# Patient Record
Sex: Female | Born: 1990 | Race: Black or African American | Hispanic: No | Marital: Single | Smoking: Former smoker
Health system: Southern US, Community
[De-identification: ages and names within clinical notes are randomized; demographics above are authoritative.]

## PROBLEM LIST (undated history)

## (undated) DIAGNOSIS — E669 Obesity, unspecified: Secondary | ICD-10-CM

## (undated) DIAGNOSIS — N915 Oligomenorrhea, unspecified: Secondary | ICD-10-CM

## (undated) DIAGNOSIS — IMO0002 Reserved for concepts with insufficient information to code with codable children: Secondary | ICD-10-CM

## (undated) DIAGNOSIS — E282 Polycystic ovarian syndrome: Secondary | ICD-10-CM

## (undated) DIAGNOSIS — R635 Abnormal weight gain: Secondary | ICD-10-CM

## (undated) HISTORY — DX: Abnormal weight gain: R63.5

## (undated) HISTORY — DX: Polycystic ovarian syndrome: E28.2

## (undated) HISTORY — DX: Oligomenorrhea, unspecified: N91.5

## (undated) HISTORY — DX: Reserved for concepts with insufficient information to code with codable children: IMO0002

---
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
## 2011-07-10 ENCOUNTER — Ambulatory Visit (INDEPENDENT_AMBULATORY_CARE_PROVIDER_SITE_OTHER): Payer: 59 | Admitting: Family Medicine

## 2011-07-10 VITALS — BP 128/80 | HR 108 | Temp 101.7°F | Resp 24 | Ht 69.0 in | Wt 298.2 lb

## 2011-07-10 DIAGNOSIS — J039 Acute tonsillitis, unspecified: Secondary | ICD-10-CM

## 2011-07-10 MED ORDER — CEFTRIAXONE SODIUM 1 G IJ SOLR
1.0000 g | Freq: Once | INTRAMUSCULAR | Status: AC
  Administered 2011-07-10: 1 g via INTRAMUSCULAR

## 2011-07-10 MED ORDER — CEFDINIR 250 MG/5ML PO SUSR: ORAL | Status: DC

## 2011-07-10 NOTE — Progress Notes (Signed)
21 year old woman who comes in with 24 hours of progressive sore throat, dysphagia, fever, and nausea. She had a sore throat one month ago and was given antibiotics but did not complete her course.  Objective: Garbled voice, no drooling, no acute distress  HEENT: Markedly swollen tonsils bilaterally with exudates, airway patent Skin: No rash Heart: No murmur Chest: Clear  Assessment: Tonsillitis  Plan: Rocephin 1 g today followed by Ceftin here to 50 per mL, 0.5 mL's per day x10 days Recheck tomorrow 1. Tonsillitis  Culture, Group A Strep, cefTRIAXone (ROCEPHIN) injection 1 g, cefdinir (OMNICEF) 250 MG/5ML suspension

## 2011-07-10 NOTE — Patient Instructions (Signed)
Tonsillitis Tonsils are lumps of lymphoid tissues at the back of the throat. Each tonsil has 20 crevices (crypts). Tonsils help fight nose and throat infections and keep infection from spreading to other parts of the body for the first 18 months of life. Tonsillitis is an infection of the throat that causes the tonsils to become red, tender, and swollen. CAUSES Sudden and, if treated, temporary (acute) tonsillitis is usually caused by infection with streptococcal bacteria. Long lasting (chronic) tonsillitis occurs when the crypts of the tonsils become filled with pieces of food and bacteria, which makes it easy for the tonsils to become constantly infected. SYMPTOMS  Symptoms of tonsillitis include:  A sore throat.   White patches on the tonsils.   Fever.   Tiredness.  DIAGNOSIS Tonsillitis can be diagnosed through a physical exam. Diagnosis can be confirmed with the results of lab tests, including a throat culture. TREATMENT  The goals of tonsillitis treatment include the reduction of the severity and duration of symptoms, prevention of associated conditions, and prevention of disease transmission. Tonsillitis caused by bacteria can be treated with antibiotics. Usually, treatment with antibiotics is started before the cause of the tonsillitis is known. However, if it is determined that the cause is not bacterial, antibiotics will not treat the tonsillitis. If attacks of tonsillitis are severe and frequent, your caregiver may recommend surgery to remove the tonsils (tonsillectomy). HOME CARE INSTRUCTIONS   Rest as much as possible and get plenty of sleep.   Drink plenty of fluids. While the throat is very sore, eat soft foods or liquids, such as sherbet, soups, or instant breakfast drinks.   Eat frozen ice pops.   Older children and adults may gargle with a warm or cold liquid to help soothe the throat. Mix 1 teaspoon of salt in 1 cup of water.   Other family members who also develop a  sore throat or fever should have a medical exam or throat culture.   Only take over-the-counter or prescription medicines for pain, discomfort, or fever as directed by your caregiver.   If you are given antibiotics, take them as directed. Finish them even if you start to feel better.  SEEK MEDICAL CARE IF:   Your baby is older than 3 months with a rectal temperature of 100.5 F (38.1 C) or higher for more than 1 day.   Large, tender lumps develop in your neck.   A rash develops.   Green, yellow-brown, or bloody substance is coughed up.   You are unable to swallow liquids or food for 24 hours.   Your child is unable to swallow food or liquids for 12 hours.  SEEK IMMEDIATE MEDICAL CARE IF:   You develop any new symptoms such as vomiting, severe headache, stiff neck, chest pain, or trouble breathing or swallowing.   You have severe throat pain along with drooling or voice changes.   You have severe pain, unrelieved with recommended medications.   You are unable to fully open the mouth.   You develop redness, swelling, or severe pain anywhere in the neck.   You have a fever.   Your baby is older than 3 months with a rectal temperature of 102 F (38.9 C) or higher.   Your baby is 12 months old or younger with a rectal temperature of 100.4 F (38 C) or higher.  MAKE SURE YOU:   Understand these instructions.   Will watch your condition.   Will get help right away if you are not  watch your condition.   Will get help right away if you are not doing well or get worse.  Document Released: 11/02/2004 Document Revised: 01/12/2011 Document Reviewed: 03/31/2010  [employer] Patient Information 2012 [employer].

## 2011-07-11 ENCOUNTER — Ambulatory Visit (INDEPENDENT_AMBULATORY_CARE_PROVIDER_SITE_OTHER): Payer: 59 | Admitting: Family Medicine

## 2011-07-11 VITALS — BP 133/82 | HR 106 | Temp 99.0°F | Resp 18 | Ht 69.0 in | Wt 298.0 lb

## 2011-07-11 DIAGNOSIS — J039 Acute tonsillitis, unspecified: Secondary | ICD-10-CM

## 2011-07-11 MED ORDER — CEFTRIAXONE SODIUM 1 G IJ SOLR
1.0000 g | Freq: Once | INTRAMUSCULAR | Status: AC
  Administered 2011-07-11: 1 g via INTRAMUSCULAR

## 2011-07-11 NOTE — Progress Notes (Signed)
21 year old woman comes in for followup tonsillitis having had her first visit yesterday. She is feeling better and has had no sweats or chills overnight. She does feel hot however. She was unable to get all of her medicine from the pharmacy yesterday but did promise to get in the next day or so.  Objective: No acute distress, patient is calm and articulate  Neck: Moderate adenopathy in the anterior cervical region, supple, no thyromegaly  Chest: Normal respirations  Oropharynx: 3+ swollen tonsils with exudates, less swollen than yesterday however.  Assessment: Improving tonsillitis  Plan: Continue Ceftin ear, second injection of Rocephin tonight, follow up as needed Throat culture pending

## 2011-07-13 LAB — CULTURE, GROUP A STREP

## 2011-08-07 DIAGNOSIS — R87619 Unspecified abnormal cytological findings in specimens from cervix uteri: Secondary | ICD-10-CM

## 2011-08-07 DIAGNOSIS — N870 Mild cervical dysplasia: Secondary | ICD-10-CM | POA: Insufficient documentation

## 2011-08-07 DIAGNOSIS — IMO0002 Reserved for concepts with insufficient information to code with codable children: Secondary | ICD-10-CM

## 2011-08-07 HISTORY — DX: Reserved for concepts with insufficient information to code with codable children: IMO0002

## 2011-08-07 HISTORY — DX: Unspecified abnormal cytological findings in specimens from cervix uteri: R87.619

## 2011-08-21 ENCOUNTER — Encounter: Payer: Self-pay | Admitting: Obstetrics and Gynecology

## 2011-08-21 ENCOUNTER — Ambulatory Visit (INDEPENDENT_AMBULATORY_CARE_PROVIDER_SITE_OTHER): Payer: 59 | Admitting: Obstetrics and Gynecology

## 2011-08-21 VITALS — BP 120/82 | HR 72 | Ht >= 80 in | Wt 298.0 lb

## 2011-08-21 DIAGNOSIS — IMO0001 Reserved for inherently not codable concepts without codable children: Secondary | ICD-10-CM

## 2011-08-21 DIAGNOSIS — Z124 Encounter for screening for malignant neoplasm of cervix: Secondary | ICD-10-CM

## 2011-08-21 DIAGNOSIS — Z113 Encounter for screening for infections with a predominantly sexual mode of transmission: Secondary | ICD-10-CM

## 2011-08-21 DIAGNOSIS — Z309 Encounter for contraceptive management, unspecified: Secondary | ICD-10-CM

## 2011-08-21 DIAGNOSIS — N915 Oligomenorrhea, unspecified: Secondary | ICD-10-CM | POA: Insufficient documentation

## 2011-08-21 DIAGNOSIS — Z01419 Encounter for gynecological examination (general) (routine) without abnormal findings: Secondary | ICD-10-CM

## 2011-08-21 MED ORDER — NORGESTIM-ETH ESTRAD TRIPHASIC 0.18/0.215/0.25 MG-25 MCG PO TABS: 1.0000 | ORAL_TABLET | Freq: Every day | ORAL | Status: DC
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
## 2011-08-21 NOTE — Progress Notes (Signed)
Subjective:    Erica Mayer is a 21 y.o. female, G0P0, who presents for an annual exam. The patient requests STD testing and a review of contraceptive methods.  Handout given on methods of contraception and an overview of each (medical and OTC). Patient interested in [facility] without a problem.  Wants Ortho Tri Cylclen Lo.  Menstrual cycle:   LMP: Patient's last menstrual period was 08/08/2011.  PMH: PCOS, insulin resistance, oligomenorrhea             Review of Systems Pertinent items are noted in HPI. Denies pelvic pain, urinary tract symptoms, vaginitis symptoms, irregular bleeding, menopausal symptoms, change in bowel habits or rectal bleeding   Objective:    BP 120/82  Pulse 72  Ht 8' 8.5" (2.654 m)  Wt 298 lb (135.172 kg)  BMI 19.19 kg/m2  LMP 08/08/2011   Wt Readings from Last 1 Encounters:  08/21/11 298 lb (135.172 kg)   Body mass index is 19.19 kg/(m^2). General Appearance: Alert, no acute distress HEENT: Grossly normal Neck / Thyroid: Supple, no thyromegaly or cervical adenopathy Lungs: Clear to auscultation bilaterally Back: No CVA tenderness Breast Exam: No masses or nodes.No dimpling, nipple retraction or discharge. Cardiovascular: Regular rate and rhythm.  Gastrointestinal: Soft, non-tender, no masses or organomegaly Pelvic Exam: EGBUS-wnl, vagina-normal rugae, cervix- without lesions or tenderness, uterus appears normal size shape and consistency-exam limited by habitus,  adnexae-no masses or tenderness Lymphatic Exam: Non-palpable nodes in neck, clavicular,  axillary, or inguinal regions  Skin: no rashes or abnormalities Extremities: no clubbing cyanosis or edema  Neurologic: grossly normal Psychiatric: Alert and oriented  UPT: negative   Assessment:   Routine GYN Exam  Need for Contraception  H/O PCOS/Insulin Resistance/Oligomenorrhea   Plan:  STD testing  BCP instructions  Ortho Tri Cyclen Lo  #1 1 po qd 11 rf  PAP sent  RTO 1 year or  prn  Marcoantonio Legault,ELMIRAPA-C

## 2011-08-22 LAB — RPR

## 2011-08-22 LAB — HIV ANTIBODY (ROUTINE TESTING W REFLEX): HIV: NONREACTIVE

## 2011-08-23 LAB — PAP IG, CT-NG, RFX HPV ASCU
Chlamydia Probe Amp: NEGATIVE
GC Probe Amp: NEGATIVE

## 2011-08-24 ENCOUNTER — Telehealth: Payer: Self-pay | Admitting: Obstetrics and Gynecology

## 2011-08-24 NOTE — Telephone Encounter (Signed)
Tc to pt regarding msg below.  Pt sched for colpo on Wednesday 09/06/11 @1115  w/ [facility], pt instructions given, pt voices understanding.

## 2011-08-24 NOTE — Telephone Encounter (Signed)
Message copied by Delon Sacramento on Thu Aug 24, 2011 10:24 AM ------      Message from: Henreitta Leber      Created: Thu Aug 24, 2011  8:40 AM       Please schedule this patient for a colposcopy.  Thank you.  EP

## 2011-09-06 ENCOUNTER — Ambulatory Visit (INDEPENDENT_AMBULATORY_CARE_PROVIDER_SITE_OTHER): Payer: 59 | Admitting: Obstetrics and Gynecology

## 2011-09-06 VITALS — BP 122/68 | Ht 67.0 in | Wt 300.0 lb

## 2011-09-06 DIAGNOSIS — R87612 Low grade squamous intraepithelial lesion on cytologic smear of cervix (LGSIL): Secondary | ICD-10-CM

## 2011-09-06 DIAGNOSIS — IMO0002 Reserved for concepts with insufficient information to code with codable children: Secondary | ICD-10-CM

## 2011-09-06 NOTE — Progress Notes (Signed)
Previous Pap Smear: 08-21-11  Previous Colposcopy: na LMP: 07-2011 irregular Contraception: none G,P: 0  Patient ID: Erica Mayer, female   DOB: Feb 22, 1990, 22 y.o.   MRN: [id number]  Chief Complaint  Patient presents with  . Gynecologic Exam    colpo    HPI Erica Mayer is a 21 y.o. female.  With pap showing LGSIL HPI  Indications: Pap smear on July 2013 showed: low-grade squamous intraepithelial neoplasia (LGSIL - encompassing HPV,mild dysplasia,CIN I). Previous colposcopy: none. Prior cervical treatment: no treatment.  Past Medical History  Diagnosis Date  . PCOS (polycystic ovarian syndrome)   . Oligomenorrhea   . Weight gain 09/09/2008    No past surgical history on file.  Family History  Problem Relation Age of Onset  . Kidney disease Mother   . Heart disease Father     congestive heart failure  . Diabetes Father   . Hypertension Father   . Cancer Paternal Aunt     Social History History  Substance Use Topics  . Smoking status: Former Smoker -- 0.3 packs/day for 4 years    Types: Cigars    Quit date: 06/16/2011  . Smokeless tobacco: Never Used   Comment: pt smoke black and mild  . Alcohol Use: Yes     occasional    No Known Allergies  Current Outpatient Prescriptions  Medication Sig Dispense Refill  . cefdinir (OMNICEF) 250 MG/5ML suspension 12.5 ml daily  100 mL  0  . Norgestimate-Ethinyl Estradiol Triphasic (ORTHO TRI-CYCLEN LO) 0.18/0.215/0.25 MG-25 MCG tablet Take 1 tablet by mouth daily.  1 Package  11    Review of Systems Review of Systems  Blood pressure 122/68, height 5\' 7"  (1.702 m), weight 300 lb (136.079 kg), last menstrual period 07/09/2011.  Physical Exam Physical Exam  Assessment    Procedure Details  The risks and benefits of the procedure and Written informed consent obtained.  Speculum placed in vagina and excellent visualization of cervix achieved, cervix swabbed x 3 with acetic acid solution.  Specimens: Biopsies from 11, 12  o'clock and ECC  Complications: none    Plan    Specimens labelled and sent to Pathology. Will call to discuss Pathology results in 2 weeks.      Iran Kievit P 09/13/2011, 12:40 AM

## 2011-09-06 NOTE — Progress Notes (Deleted)
Patient ID: Lisset Ketchem, female   DOB: 05-05-1990, 21 y.o.   MRN: [id number]  Chief Complaint  Patient presents with  . Gynecologic Exam    colpo    HPI Kanyon Bunn is a 21 y.o. female.  *** HPI  Indications: Pap smear on {MONTH:10108} 20*** showed: no abnormalities. Previous colposcopy: in ***. Prior cervical treatment: {Therapies; recommendations colposcopy:727}.  Past Medical History  Diagnosis Date  . PCOS (polycystic ovarian syndrome)   . Oligomenorrhea   . Weight gain 09/09/2008    No past surgical history on file.  Family History  Problem Relation Age of Onset  . Kidney disease Mother   . Heart disease Father     congestive heart failure  . Diabetes Father   . Hypertension Father   . Cancer Paternal Aunt     Social History History  Substance Use Topics  . Smoking status: Former Smoker -- 0.3 packs/day for 4 years    Types: Cigars    Quit date: 06/16/2011  . Smokeless tobacco: Never Used   Comment: pt smoke black and mild  . Alcohol Use: Yes     occasional    No Known Allergies  Current Outpatient Prescriptions  Medication Sig Dispense Refill  . cefdinir (OMNICEF) 250 MG/5ML suspension 12.5 ml daily  100 mL  0  . Norgestimate-Ethinyl Estradiol Triphasic (ORTHO TRI-CYCLEN LO) 0.18/0.215/0.25 MG-25 MCG tablet Take 1 tablet by mouth daily.  1 Package  11    Review of Systems Review of Systems  Last menstrual period 08/08/2011.  Physical Exam Physical Exam  Data Reviewed ***  Assessment    Procedure Details  The risks and benefits of the procedure and Written informed consent obtained.

## 2011-09-10 DIAGNOSIS — IMO0002 Reserved for concepts with insufficient information to code with codable children: Secondary | ICD-10-CM | POA: Insufficient documentation

## 2011-11-22 ENCOUNTER — Other Ambulatory Visit: Payer: Self-pay | Admitting: Obstetrics and Gynecology

## 2011-11-22 ENCOUNTER — Telehealth: Payer: Self-pay | Admitting: Obstetrics and Gynecology

## 2011-11-22 MED ORDER — NORGESTIM-ETH ESTRAD TRIPHASIC 0.18/0.215/0.25 MG-25 MCG PO TABS: 1.0000 | ORAL_TABLET | Freq: Every day | ORAL | Status: DC

## 2011-11-22 NOTE — Telephone Encounter (Signed)
VM from pt.  Recevied Rx in 08/2011 but did not get filled.  Wants new Rx.  [phone]

## 2011-11-22 NOTE — Telephone Encounter (Signed)
Spoke with pt rgd msg pt states need rx for bc pt states given rx in July never filled rx pt state cycle started 11/18/11 last intercourse June advised pt will consult with provider and call her back pt voice understanding

## 2011-11-22 NOTE — Telephone Encounter (Signed)
Order for Ortho Tri Cyclen Lo was placed as requested for patient with refills for one year.  She should be reminded to use a back up method for the first cycle of pills.  Alvenia Treese, PA-C

## 2011-11-23 NOTE — Telephone Encounter (Signed)
Lm on vm rx sent to pharm use bum first pack of pills

## 2011-12-21 ENCOUNTER — Other Ambulatory Visit: Payer: Self-pay

## 2011-12-21 ENCOUNTER — Telehealth: Payer: Self-pay | Admitting: Obstetrics and Gynecology

## 2011-12-21 MED ORDER — NORGESTIM-ETH ESTRAD TRIPHASIC 0.18/0.215/0.25 MG-25 MCG PO TABS: 1.0000 | ORAL_TABLET | Freq: Every day | ORAL | Status: DC

## 2011-12-21 MED ORDER — NORETHINDRONE ACET-ETHINYL EST 1-20 MG-MCG PO TABS: 1.0000 | ORAL_TABLET | Freq: Every day | ORAL | Status: DC

## 2011-12-21 NOTE — Telephone Encounter (Signed)
Pt needs generic rx for Ortho Tricyclen Lo;however generic not made. Pt states,"ins will pay for any generic". Will consult with provider per recs on a generic bcp.

## 2011-12-21 NOTE — Telephone Encounter (Signed)
Patient requesting a generic for Ortho Tri Cyclen Lo.  Since there is none she may have her alternative "any generic".  Prescribe Microgestin 1/20  #1  1 po qd with 11 refills.  Thank you.  EP

## 2011-12-21 NOTE — Telephone Encounter (Signed)
Rx e-pres to pharm on file per EP recs.

## 2012-01-01 ENCOUNTER — Encounter (INDEPENDENT_AMBULATORY_CARE_PROVIDER_SITE_OTHER): Payer: 59 | Admitting: Obstetrics and Gynecology

## 2012-01-01 ENCOUNTER — Encounter: Payer: Self-pay | Admitting: Obstetrics and Gynecology

## 2012-01-01 DIAGNOSIS — N870 Mild cervical dysplasia: Secondary | ICD-10-CM

## 2012-01-01 NOTE — Progress Notes (Signed)
erro  neous encounter

## 2012-12-01 ENCOUNTER — Emergency Department (HOSPITAL_COMMUNITY): Payer: 59

## 2012-12-01 ENCOUNTER — Encounter (HOSPITAL_COMMUNITY): Payer: Self-pay | Admitting: Emergency Medicine

## 2012-12-01 ENCOUNTER — Emergency Department (HOSPITAL_COMMUNITY)
Admission: EM | Admit: 2012-12-01 | Discharge: 2012-12-01 | Disposition: A | Discharge status: Home / Self Care | Payer: 59 | Attending: Emergency Medicine | Admitting: Emergency Medicine

## 2012-12-01 DIAGNOSIS — Z79899 Other long term (current) drug therapy: Secondary | ICD-10-CM | POA: Insufficient documentation

## 2012-12-01 DIAGNOSIS — M79609 Pain in unspecified limb: Secondary | ICD-10-CM | POA: Insufficient documentation

## 2012-12-01 DIAGNOSIS — Z8742 Personal history of other diseases of the female genital tract: Secondary | ICD-10-CM | POA: Insufficient documentation

## 2012-12-01 DIAGNOSIS — G8911 Acute pain due to trauma: Secondary | ICD-10-CM | POA: Insufficient documentation

## 2012-12-01 DIAGNOSIS — Z23 Encounter for immunization: Secondary | ICD-10-CM | POA: Insufficient documentation

## 2012-12-01 DIAGNOSIS — IMO0002 Reserved for concepts with insufficient information to code with codable children: Secondary | ICD-10-CM

## 2012-12-01 DIAGNOSIS — Z87891 Personal history of nicotine dependence: Secondary | ICD-10-CM | POA: Insufficient documentation

## 2012-12-01 MED ORDER — TETANUS-DIPHTH-ACELL PERTUSSIS 5-2.5-18.5 LF-MCG/0.5 IM SUSP
0.5000 mL | Freq: Once | INTRAMUSCULAR | Status: AC
  Administered 2012-12-01: 0.5 mL via INTRAMUSCULAR
  Filled 2012-12-01: qty 0.5

## 2012-12-01 MED ORDER — CEPHALEXIN 500 MG PO CAPS: 500.0000 mg | ORAL_CAPSULE | Freq: Four times a day (QID) | ORAL | Status: DC
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
## 2012-12-01 NOTE — ED Provider Notes (Signed)
Medical screening examination/treatment/procedure(s) were performed by non-physician practitioner and as supervising physician I was immediately available for consultation/collaboration.  EKG Interpretation   None         Leylah Tarnow, MD 12/01/12 2354 

## 2013-10-29 ENCOUNTER — Encounter (HOSPITAL_COMMUNITY): Payer: Self-pay | Admitting: Emergency Medicine

## 2013-10-29 ENCOUNTER — Emergency Department (HOSPITAL_COMMUNITY): Payer: No Typology Code available for payment source

## 2013-10-29 ENCOUNTER — Inpatient Hospital Stay (HOSPITAL_COMMUNITY)
Admission: EM | Admit: 2013-10-29 | Discharge: 2013-11-07 | DRG: 411 | Disposition: A | Discharge status: Home / Self Care | Payer: No Typology Code available for payment source | Attending: General Surgery | Admitting: General Surgery

## 2013-10-29 ENCOUNTER — Emergency Department (HOSPITAL_COMMUNITY)
Admission: EM | Admit: 2013-10-29 | Discharge: 2013-10-29 | Discharge status: Against Medical Advice | Payer: No Typology Code available for payment source | Source: Home / Self Care

## 2013-10-29 DIAGNOSIS — K851 Biliary acute pancreatitis without necrosis or infection: Secondary | ICD-10-CM | POA: Diagnosis present

## 2013-10-29 DIAGNOSIS — K8060 Calculus of gallbladder and bile duct with cholecystitis, unspecified, without obstruction: Secondary | ICD-10-CM | POA: Diagnosis present

## 2013-10-29 DIAGNOSIS — Z6841 Body Mass Index (BMI) 40.0 and over, adult: Secondary | ICD-10-CM | POA: Diagnosis not present

## 2013-10-29 DIAGNOSIS — R7989 Other specified abnormal findings of blood chemistry: Secondary | ICD-10-CM | POA: Diagnosis present

## 2013-10-29 DIAGNOSIS — Z87410 Personal history of cervical dysplasia: Secondary | ICD-10-CM | POA: Diagnosis not present

## 2013-10-29 DIAGNOSIS — K819 Cholecystitis, unspecified: Secondary | ICD-10-CM

## 2013-10-29 DIAGNOSIS — Z833 Family history of diabetes mellitus: Secondary | ICD-10-CM

## 2013-10-29 DIAGNOSIS — R109 Unspecified abdominal pain: Secondary | ICD-10-CM | POA: Diagnosis present

## 2013-10-29 DIAGNOSIS — Z87891 Personal history of nicotine dependence: Secondary | ICD-10-CM | POA: Diagnosis not present

## 2013-10-29 DIAGNOSIS — E669 Obesity, unspecified: Secondary | ICD-10-CM | POA: Insufficient documentation

## 2013-10-29 DIAGNOSIS — Z8249 Family history of ischemic heart disease and other diseases of the circulatory system: Secondary | ICD-10-CM

## 2013-10-29 DIAGNOSIS — Z809 Family history of malignant neoplasm, unspecified: Secondary | ICD-10-CM | POA: Diagnosis not present

## 2013-10-29 DIAGNOSIS — E282 Polycystic ovarian syndrome: Secondary | ICD-10-CM | POA: Diagnosis present

## 2013-10-29 DIAGNOSIS — R1013 Epigastric pain: Secondary | ICD-10-CM

## 2013-10-29 DIAGNOSIS — Z841 Family history of disorders of kidney and ureter: Secondary | ICD-10-CM | POA: Diagnosis not present

## 2013-10-29 DIAGNOSIS — R0602 Shortness of breath: Secondary | ICD-10-CM

## 2013-10-29 HISTORY — DX: Obesity, unspecified: E66.9

## 2013-10-29 LAB — COMPREHENSIVE METABOLIC PANEL
ALBUMIN: 3.8 g/dL (ref 3.5–5.2)
ALK PHOS: 219 U/L — AB (ref 39–117)
ALT: 1083 U/L — ABNORMAL HIGH (ref 0–35)
ALT: 1028 U/L — ABNORMAL HIGH (ref 0–35)
AST: 516 U/L — AB (ref 0–37)
AST: 427 U/L — AB (ref 0–37)
Albumin: 3.9 g/dL (ref 3.5–5.2)
Alkaline Phosphatase: 214 U/L — ABNORMAL HIGH (ref 39–117)
Anion gap: 13 (ref 5–15)
Anion gap: 13 (ref 5–15)
BILIRUBIN TOTAL: 0.7 mg/dL (ref 0.3–1.2)
BUN: 9 mg/dL (ref 6–23)
BUN: 9 mg/dL (ref 6–23)
CALCIUM: 9.2 mg/dL (ref 8.4–10.5)
CHLORIDE: 102 meq/L (ref 96–112)
CO2: 24 mEq/L (ref 19–32)
CO2: 24 mEq/L (ref 19–32)
Calcium: 9.1 mg/dL (ref 8.4–10.5)
Chloride: 100 mEq/L (ref 96–112)
Creatinine, Ser: 0.76 mg/dL (ref 0.50–1.10)
Creatinine, Ser: 0.81 mg/dL (ref 0.50–1.10)
GFR calc Af Amer: >90 mL/min (ref >90)
GFR calc Af Amer: >90 mL/min (ref >90)
GFR calc non Af Amer: >90 mL/min (ref >90)
GFR calc non Af Amer: >90 mL/min (ref >90)
Glucose, Bld: 105 mg/dL — ABNORMAL HIGH (ref 70–99)
Glucose, Bld: 106 mg/dL — ABNORMAL HIGH (ref 70–99)
POTASSIUM: 4.2 meq/L (ref 3.7–5.3)
Potassium: 3.7 mEq/L (ref 3.7–5.3)
SODIUM: 139 meq/L (ref 137–147)
SODIUM: 137 meq/L (ref 137–147)
TOTAL PROTEIN: 7.9 g/dL (ref 6.0–8.3)
Total Bilirubin: 0.6 mg/dL (ref 0.3–1.2)
Total Protein: 8.1 g/dL (ref 6.0–8.3)

## 2013-10-29 LAB — CBC WITH DIFFERENTIAL/PLATELET
BASOS ABS: 0 10*3/uL (ref 0.0–0.1)
BASOS ABS: 0 10*3/uL (ref 0.0–0.1)
BASOS PCT: 0 % (ref 0–1)
BASOS PCT: 0 % (ref 0–1)
EOS PCT: 0 % (ref 0–5)
Eosinophils Absolute: 0 10*3/uL (ref 0.0–0.7)
Eosinophils Absolute: 0 10*3/uL (ref 0.0–0.7)
Eosinophils Relative: 0 % (ref 0–5)
HCT: 40 % (ref 36.0–46.0)
HEMATOCRIT: 41.6 % (ref 36.0–46.0)
HEMOGLOBIN: 13.7 g/dL (ref 12.0–15.0)
Hemoglobin: 14 g/dL (ref 12.0–15.0)
LYMPHS PCT: 9 % — AB (ref 12–46)
Lymphocytes Relative: 10 % — ABNORMAL LOW (ref 12–46)
Lymphs Abs: 1.2 10*3/uL (ref 0.7–4.0)
Lymphs Abs: 1.4 10*3/uL (ref 0.7–4.0)
MCH: 28.8 pg (ref 26.0–34.0)
MCH: 29 pg (ref 26.0–34.0)
MCHC: 34.3 g/dL (ref 30.0–36.0)
MCHC: 33.7 g/dL (ref 30.0–36.0)
MCV: 84.2 fL (ref 78.0–100.0)
MCV: 86.3 fL (ref 78.0–100.0)
MONO ABS: 0.9 10*3/uL (ref 0.1–1.0)
Monocytes Absolute: 0.6 10*3/uL (ref 0.1–1.0)
Monocytes Relative: 5 % (ref 3–12)
Monocytes Relative: 6 % (ref 3–12)
NEUTROS ABS: 10.6 10*3/uL — AB (ref 1.7–7.7)
Neutro Abs: 12.4 10*3/uL — ABNORMAL HIGH (ref 1.7–7.7)
Neutrophils Relative %: 85 % — ABNORMAL HIGH (ref 43–77)
Neutrophils Relative %: 85 % — ABNORMAL HIGH (ref 43–77)
PLATELETS: 288 10*3/uL (ref 150–400)
PLATELETS: 287 10*3/uL (ref 150–400)
RBC: 4.75 MIL/uL (ref 3.87–5.11)
RBC: 4.82 MIL/uL (ref 3.87–5.11)
RDW: 14.6 % (ref 11.5–15.5)
RDW: 15 % (ref 11.5–15.5)
WBC: 12.4 10*3/uL — ABNORMAL HIGH (ref 4.0–10.5)
WBC: 14.8 10*3/uL — AB (ref 4.0–10.5)

## 2013-10-29 LAB — URINALYSIS, ROUTINE W REFLEX MICROSCOPIC
Glucose, UA: NEGATIVE mg/dL
Glucose, UA: NEGATIVE mg/dL
Hgb urine dipstick: NEGATIVE
Hgb urine dipstick: NEGATIVE
Ketones, ur: >80 mg/dL — AB
Ketones, ur: >80 mg/dL — AB
LEUKOCYTES UA: NEGATIVE
Leukocytes, UA: NEGATIVE
Nitrite: NEGATIVE
Nitrite: NEGATIVE
PH: 6 (ref 5.0–8.0)
PH: 6 (ref 5.0–8.0)
Protein, ur: 30 mg/dL — AB
Protein, ur: 30 mg/dL — AB
SPECIFIC GRAVITY, URINE: 1.028 (ref 1.005–1.030)
Specific Gravity, Urine: 1.031 — ABNORMAL HIGH (ref 1.005–1.030)
UROBILINOGEN UA: 1 mg/dL (ref 0.0–1.0)
Urobilinogen, UA: 1 mg/dL (ref 0.0–1.0)

## 2013-10-29 LAB — URINE MICROSCOPIC-ADD ON

## 2013-10-29 LAB — LIPASE, BLOOD
LIPASE: 863 U/L — AB (ref 11–59)
Lipase: 1214 U/L — ABNORMAL HIGH (ref 11–59)

## 2013-10-29 LAB — POC URINE PREG, ED: PREG TEST UR: NEGATIVE

## 2013-10-29 MED ORDER — SODIUM CHLORIDE 0.9 % IV BOLUS (SEPSIS)
1000.0000 mL | INTRAVENOUS | Status: AC
  Administered 2013-10-29: 1000 mL via INTRAVENOUS

## 2013-10-29 MED ORDER — ONDANSETRON HCL 4 MG/2ML IJ SOLN
4.0000 mg | Freq: Once | INTRAMUSCULAR | Status: AC
  Administered 2013-10-29: 4 mg via INTRAVENOUS
  Filled 2013-10-29: qty 2

## 2013-10-29 MED ORDER — MORPHINE SULFATE 4 MG/ML IJ SOLN
4.0000 mg | Freq: Once | INTRAMUSCULAR | Status: AC
  Administered 2013-10-29: 4 mg via INTRAVENOUS
  Filled 2013-10-29: qty 1

## 2013-10-29 NOTE — ED Notes (Signed)
Pt reports upper epigastric pain that started yesterday and feels like "its pulling." pain radiates into her back and having sob.

## 2013-10-29 NOTE — ED Provider Notes (Signed)
CSN: [id number]     Arrival date & time 10/29/13  1432 History   First MD Initiated Contact with Patient 10/29/13 1718     Chief Complaint  Patient presents with  . Abdominal Pain     (Consider location/radiation/quality/duration/timing/severity/associated sxs/prior Treatment) The history is provided by the patient and medical records. No language interpreter was used.    Erica Mayer is a 23 y.o. female  with a hx of PCOS, obesity presents to the Emergency Department complaining of gradual, persistent, progressively worsening epigastric abd pain onset yesterday afternoon persisting into today.  Pt reports several weeks of heartburn, resolved each time with soda.  Pt reports this was occuring with any type of food or drink, but worse with spicy foods.  Pt reports that the heartburn was associated sweating and burning in her chest, but these have not been present for the last 24 hours.  Pt reports feeling as if there is a knot in her stomach that goes to her back.  Pt reports several episodes of emesis, NBNB.  Pt denies diarrhea.  Nothing makes this symptoms better or worse.  Pt denies fever, chills headache, neck pain, chest pain, SOB, diarrhea, weakness, dizziness, syncope.    Last oral intake yesterday at 2pm.  LMP: > 2 mos ago - pt reports irregular cycles.     Past Medical History  Diagnosis Date  . PCOS (polycystic ovarian syndrome)   . Oligomenorrhea   . Weight gain 09/09/2008  . Abnormal Pap smear 08/2011    LGSIL/CIN-1  . Obesity    History reviewed. No pertinent past surgical history. Family History  Problem Relation Age of Onset  . Kidney disease Mother   . Heart disease Father     congestive heart failure  . Diabetes Father   . Hypertension Father   . Cancer Paternal Aunt    History  Substance Use Topics  . Smoking status: Former Smoker -- 0.30 packs/day for 4 years    Types: Cigars    Quit date: 06/16/2011  . Smokeless tobacco: Never Used     Comment: pt smoke black  and mild  . Alcohol Use: Yes     Comment: occasional   OB History   Grav Para Term Preterm Abortions TAB SAB Ect Mult Living   0         0     Review of Systems  Constitutional: Negative for fever, diaphoresis, appetite change, fatigue and unexpected weight change.  HENT: Negative for mouth sores and trouble swallowing.   Respiratory: Negative for cough, chest tightness, shortness of breath, wheezing and stridor.   Cardiovascular: Negative for chest pain and palpitations.  Gastrointestinal: Positive for nausea, vomiting and abdominal pain. Negative for diarrhea, constipation, blood in stool, abdominal distention and rectal pain.  Genitourinary: Negative for dysuria, urgency, frequency, hematuria, flank pain and difficulty urinating.  Musculoskeletal: Negative for back pain, neck pain and neck stiffness.  Skin: Negative for rash.  Neurological: Negative for weakness.  Hematological: Negative for adenopathy.  Psychiatric/Behavioral: Negative for confusion.  All other systems reviewed and are negative.     Allergies  Review of patient's allergies indicates no known allergies.  Home Medications   Prior to Admission medications   Not on File   BP 134/67  Pulse 94  Temp(Src) 98 F (36.7 C) (Oral)  Resp 18  SpO2 99%  LMP 09/10/2013 Physical Exam  Nursing note and vitals reviewed. Constitutional: She appears well-developed and well-nourished.  HENT:  Head: Normocephalic and atraumatic.  Mouth/Throat: Oropharynx is clear and moist.  Eyes: Conjunctivae are normal. No scleral icterus.  Cardiovascular: Normal rate, regular rhythm, normal heart sounds and intact distal pulses.   No murmur heard. No tachycardia  Pulmonary/Chest: Effort normal and breath sounds normal.  Clear and equal breath sounds Equal chest rise  Abdominal: Soft. Bowel sounds are normal. She exhibits no distension and no mass. There is tenderness in the right upper quadrant and epigastric area. There is  guarding. There is no rebound and no CVA tenderness.  Tenderness palpation epigastrium right upper quadrant Positive Murphy's sign No CVA tenderness  Neurological: She is alert.  Skin: Skin is warm and dry.  Psychiatric: She has a normal mood and affect.    ED Course  Procedures (including critical care time) Labs Review Labs Reviewed  CBC WITH DIFFERENTIAL - Abnormal; Notable for the following:    WBC 14.8 (*)    Neutrophils Relative % 85 (*)    Neutro Abs 12.4 (*)    Lymphocytes Relative 9 (*)    All other components within normal limits  COMPREHENSIVE METABOLIC PANEL - Abnormal; Notable for the following:    Glucose, Bld 106 (*)    AST 427 (*)    ALT 1028 (*)    Alkaline Phosphatase 214 (*)    All other components within normal limits  LIPASE, BLOOD - Abnormal; Notable for the following:    Lipase 863 (*)    All other components within normal limits  URINALYSIS, ROUTINE W REFLEX MICROSCOPIC - Abnormal; Notable for the following:    Color, Urine AMBER (*)    Specific Gravity, Urine 1.031 (*)    Bilirubin Urine SMALL (*)    Ketones, ur >80 (*)    Protein, ur 30 (*)    All other components within normal limits  URINE MICROSCOPIC-ADD ON - Abnormal; Notable for the following:    Squamous Epithelial / LPF FEW (*)    All other components within normal limits  POC URINE PREG, ED    Imaging Review US Abdomen Complete  10/29/2013   CLINICAL DATA:  Abdominal pain.  EXAM: ULTRASOUND ABDOMEN COMPLETE  COMPARISON:  None.  FINDINGS: Gallbladder:  Numerous gallstones are noted in the gallbladder with associated acoustic shadowing. The gallbladder wall is mildly thickened. No pericholecystic fluid.  Common bile duct:  Diameter: 6.9 mm  Liver:  Normal echogenicity without focal lesions. No intrahepatic biliary dilatation. A small amount of perihepatic fluid is noted.  IVC:  Normal caliber.  Pancreas:  Limited visualization due to poor sonographic window.  Spleen:  Normal size and  echogenicity without focal lesions.  Right Kidney:  Length: 11.2 cm. Normal renal cortical thickness and echogenicity without focal lesions or hydronephrosis.  Left Kidney:  Length: 12.3 cm. Normal renal cortical thickness and echogenicity without focal lesions or hydronephrosis.  Abdominal aorta:  Normal caliber.  Other findings:  None.  IMPRESSION: 1. Cholelithiasis and sonographic findings suggesting acute cholecystitis. There is also mild common bile duct dilatation. 2. Small amount of perihepatic fluid. 3. Limited visualization of the pancreas. 4. Normal spleen and kidneys.   Electronically Signed   By: Loralie Champagne M.D.   On: 10/29/2013 22:16     EKG Interpretation None      MDM   Final diagnoses:  Cholecystitis  Gallstone pancreatitis   Erica Mayer presents with epigastric right quadrant abdominal pain worsening in the last 24 hours. Patient reports several episodes of nonbloody, not bilious emesis.  Labs with leukocytosis, elevated transaminase and  elevated lipase. Concern for gallstone pancreatitis. Will obtain ultrasound, give fluids and pain control.  Patient is to be n.p.o.  9:06 PM [place] continues to pend. Pt with adequate pain relief.    11:05 PM [place] with evidence of Cholelithiasis and sonographic findings suggesting acute cholecystitis. There is also mild common bile duct dilatation.  Patient discussed with general surgery who will admit.  The patient was discussed with and seen by Dr. Criss Alvine who agrees with the treatment plan.      Dahlia Client Michala Deblanc, PA-C 10/29/13 2306

## 2013-10-29 NOTE — ED Notes (Signed)
Pt checked in to [facility] this morning, did not stay because of wait, came to [facility] ED. Pt reports abdominal pain 5/10. Vomited last night.

## 2013-10-29 NOTE — H&P (Signed)
Erica Mayer is an 23 y.o. female.   Chief Complaint: abdominal pain HPI:  The pt is a 23yo bf who presents with abdominal pain for the last day. This is the first time she has had a pain like this. It has been associated with nausea and vomiting. Pain radiates into back. U/s shows gallstones with inflammation. Her LFT's are significantly elevated along with her lipase.  Past Medical History  Diagnosis Date  . PCOS (polycystic ovarian syndrome)   . Oligomenorrhea   . Weight gain 09/09/2008  . Abnormal Pap smear 08/2011    LGSIL/CIN-1  . Obesity     History reviewed. No pertinent past surgical history.  Family History  Problem Relation Age of Onset  . Kidney disease Mother   . Heart disease Father     congestive heart failure  . Diabetes Father   . Hypertension Father   . Cancer Paternal Aunt    Social History:  reports that she quit smoking about 2 years ago. Her smoking use included Cigars. She has never used smokeless tobacco. She reports that she drinks alcohol. She reports that she does not use illicit drugs.  Allergies: No Known Allergies   (Not in a hospital admission)  Results for orders placed during the hospital encounter of 10/29/13 (from the past 48 hour(s))  CBC WITH DIFFERENTIAL     Status: Abnormal   Collection Time    10/29/13  3:19 PM      Result Value Ref Range   WBC 14.8 (*) 4.0 - 10.5 K/uL   RBC 4.82  3.87 - 5.11 MIL/uL   Hemoglobin 14.0  12.0 - 15.0 g/dL   HCT 41.6  36.0 - 46.0 %   MCV 86.3  78.0 - 100.0 fL   MCH 29.0  26.0 - 34.0 pg   MCHC 33.7  30.0 - 36.0 g/dL   RDW 15.0  11.5 - 15.5 %   Platelets 287  150 - 400 K/uL   Neutrophils Relative % 85 (*) 43 - 77 %   Neutro Abs 12.4 (*) 1.7 - 7.7 K/uL   Lymphocytes Relative 9 (*) 12 - 46 %   Lymphs Abs 1.4  0.7 - 4.0 K/uL   Monocytes Relative 6  3 - 12 %   Monocytes Absolute 0.9  0.1 - 1.0 K/uL   Eosinophils Relative 0  0 - 5 %   Eosinophils Absolute 0.0  0.0 - 0.7 K/uL   Basophils Relative 0  0 - 1 %    Basophils Absolute 0.0  0.0 - 0.1 K/uL  COMPREHENSIVE METABOLIC PANEL     Status: Abnormal   Collection Time    10/29/13  3:19 PM      Result Value Ref Range   Sodium 137  137 - 147 mEq/L   Potassium 3.7  3.7 - 5.3 mEq/L   Chloride 100  96 - 112 mEq/L   CO2 24  19 - 32 mEq/L   Glucose, Bld 106 (*) 70 - 99 mg/dL   BUN 9  6 - 23 mg/dL   Creatinine, Ser 0.81  0.50 - 1.10 mg/dL   Calcium 9.2  8.4 - 10.5 mg/dL   Total Protein 8.1  6.0 - 8.3 g/dL   Albumin 3.9  3.5 - 5.2 g/dL   AST 427 (*) 0 - 37 U/L   ALT 1028 (*) 0 - 35 U/L   Alkaline Phosphatase 214 (*) 39 - 117 U/L   Total Bilirubin 0.6  0.3 - 1.2 mg/dL  GFR calc non Af Amer >90  >90 mL/min   GFR calc Af Amer >90  >90 mL/min   Comment: (NOTE)     The eGFR has been calculated using the CKD EPI equation.     This calculation has not been validated in all clinical situations.     eGFR's persistently <90 mL/min signify possible Chronic Kidney     Disease.   Anion gap 13  5 - 15  LIPASE, BLOOD     Status: Abnormal   Collection Time    10/29/13  3:19 PM      Result Value Ref Range   Lipase 863 (*) 11 - 59 U/L  URINALYSIS, ROUTINE W REFLEX MICROSCOPIC     Status: Abnormal   Collection Time    10/29/13  4:57 PM      Result Value Ref Range   Color, Urine AMBER (*) YELLOW   Comment: BIOCHEMICALS MAY BE AFFECTED BY COLOR   APPearance CLEAR  CLEAR   Specific Gravity, Urine 1.031 (*) 1.005 - 1.030   pH 6.0  5.0 - 8.0   Glucose, UA NEGATIVE  NEGATIVE mg/dL   Hgb urine dipstick NEGATIVE  NEGATIVE   Bilirubin Urine SMALL (*) NEGATIVE   Ketones, ur >80 (*) NEGATIVE mg/dL   Protein, ur 30 (*) NEGATIVE mg/dL   Urobilinogen, UA 1.0  0.0 - 1.0 mg/dL   Nitrite NEGATIVE  NEGATIVE   Leukocytes, UA NEGATIVE  NEGATIVE  URINE MICROSCOPIC-ADD ON     Status: Abnormal   Collection Time    10/29/13  4:57 PM      Result Value Ref Range   Squamous Epithelial / LPF FEW (*) RARE   WBC, UA 0-2  <3 WBC/hpf   Urine-Other MUCOUS PRESENT    POC  URINE PREG, ED     Status: None   Collection Time    10/29/13  5:06 PM      Result Value Ref Range   Preg Test, Ur NEGATIVE  NEGATIVE   Comment:            THE SENSITIVITY OF THIS     METHODOLOGY IS >24 mIU/mL   US Abdomen Complete  10/29/2013   CLINICAL DATA:  Abdominal pain.  EXAM: ULTRASOUND ABDOMEN COMPLETE  COMPARISON:  None.  FINDINGS: Gallbladder:  Numerous gallstones are noted in the gallbladder with associated acoustic shadowing. The gallbladder wall is mildly thickened. No pericholecystic fluid.  Common bile duct:  Diameter: 6.9 mm  Liver:  Normal echogenicity without focal lesions. No intrahepatic biliary dilatation. A small amount of perihepatic fluid is noted.  IVC:  Normal caliber.  Pancreas:  Limited visualization due to poor sonographic window.  Spleen:  Normal size and echogenicity without focal lesions.  Right Kidney:  Length: 11.2 cm. Normal renal cortical thickness and echogenicity without focal lesions or hydronephrosis.  Left Kidney:  Length: 12.3 cm. Normal renal cortical thickness and echogenicity without focal lesions or hydronephrosis.  Abdominal aorta:  Normal caliber.  Other findings:  None.  IMPRESSION: 1. Cholelithiasis and sonographic findings suggesting acute cholecystitis. There is also mild common bile duct dilatation. 2. Small amount of perihepatic fluid. 3. Limited visualization of the pancreas. 4. Normal spleen and kidneys.   Electronically Signed   By: Kalman Jewels M.D.   On: 10/29/2013 22:16    Review of Systems  Constitutional: Negative.   HENT: Negative.   Eyes: Negative.   Respiratory: Negative.   Cardiovascular: Negative.   Gastrointestinal: Positive for nausea, vomiting and  abdominal pain.  Genitourinary: Negative.   Musculoskeletal: Positive for back pain.  Skin: Negative.   Neurological: Negative.   Endo/Heme/Allergies: Negative.   Psychiatric/Behavioral: Negative.     Blood pressure 134/67, pulse 94, temperature 98 F (36.7 C),  temperature source Oral, resp. rate 18, last menstrual period 09/10/2013, SpO2 99.00%. Physical Exam  Constitutional: She is oriented to person, place, and time. She appears well-developed and well-nourished.  HENT:  Head: Normocephalic and atraumatic.  Eyes: Conjunctivae and EOM are normal. Pupils are equal, round, and reactive to light.  Neck: Normal range of motion. Neck supple.  Cardiovascular: Normal rate, regular rhythm and normal heart sounds.   Respiratory: Effort normal and breath sounds normal.  GI: Soft. Bowel sounds are normal. There is no tenderness.  There is tenderness centrally and in the upper abdomen. Pain radiates to back  Musculoskeletal: Normal range of motion.  Neurological: She is alert and oriented to person, place, and time.  Skin: Skin is warm and dry.  Psychiatric: She has a normal mood and affect. Her behavior is normal.     Assessment/Plan The pt appears to have gallstone pancreatitis. Will admit for IV hydration and bowel rest. Will recheck lft's and lipase in am. If these values do not improve then she will need GI consult and ERCP prior to removing gallbladder.  TOTH III,[place] Doster S 10/29/2013, 11:49 PM

## 2013-10-30 LAB — COMPREHENSIVE METABOLIC PANEL
ALK PHOS: 163 U/L — AB (ref 39–117)
ALT: 808 U/L — AB (ref 0–35)
AST: 285 U/L — AB (ref 0–37)
Albumin: 3.2 g/dL — ABNORMAL LOW (ref 3.5–5.2)
Anion gap: 10 (ref 5–15)
BUN: 9 mg/dL (ref 6–23)
CO2: 25 meq/L (ref 19–32)
Calcium: 8.3 mg/dL — ABNORMAL LOW (ref 8.4–10.5)
Chloride: 102 mEq/L (ref 96–112)
Creatinine, Ser: 0.84 mg/dL (ref 0.50–1.10)
GFR calc non Af Amer: >90 mL/min (ref >90)
Glucose, Bld: 120 mg/dL — ABNORMAL HIGH (ref 70–99)
POTASSIUM: 3.8 meq/L (ref 3.7–5.3)
SODIUM: 137 meq/L (ref 137–147)
TOTAL PROTEIN: 6.8 g/dL (ref 6.0–8.3)
Total Bilirubin: 0.8 mg/dL (ref 0.3–1.2)

## 2013-10-30 LAB — CBC
HCT: 36.6 % (ref 36.0–46.0)
HEMOGLOBIN: 12.2 g/dL (ref 12.0–15.0)
MCH: 28.7 pg (ref 26.0–34.0)
MCHC: 33.3 g/dL (ref 30.0–36.0)
MCV: 86.1 fL (ref 78.0–100.0)
Platelets: 269 10*3/uL (ref 150–400)
RBC: 4.25 MIL/uL (ref 3.87–5.11)
RDW: 15.2 % (ref 11.5–15.5)
WBC: 13.7 10*3/uL — ABNORMAL HIGH (ref 4.0–10.5)

## 2013-10-30 LAB — LIPASE, BLOOD: LIPASE: 491 U/L — AB (ref 11–59)

## 2013-10-30 MED ORDER — ONDANSETRON HCL 4 MG/2ML IJ SOLN: 4.0000 mg | Freq: Four times a day (QID) | INTRAMUSCULAR | Status: DC | PRN

## 2013-10-30 MED ORDER — HEPARIN SODIUM (PORCINE) 5000 UNIT/ML IJ SOLN
5000.0000 [IU] | Freq: Three times a day (TID) | INTRAMUSCULAR | Status: AC
  Administered 2013-10-30 – 2013-11-02 (×10): 5000 [IU] via SUBCUTANEOUS
  Filled 2013-10-30 (×11): qty 1

## 2013-10-30 MED ORDER — PANTOPRAZOLE SODIUM 40 MG IV SOLR
40.0000 mg | Freq: Every day | INTRAVENOUS | Status: DC
  Administered 2013-10-30 – 2013-11-06 (×9): 40 mg via INTRAVENOUS
  Filled 2013-10-30 (×10): qty 40

## 2013-10-30 MED ORDER — KCL IN DEXTROSE-NACL 20-5-0.9 MEQ/L-%-% IV SOLN
INTRAVENOUS | Status: DC
  Administered 2013-10-30 – 2013-11-02 (×6): via INTRAVENOUS
  Filled 2013-10-30 (×12): qty 1000

## 2013-10-30 MED ORDER — MORPHINE SULFATE 4 MG/ML IJ SOLN
4.0000 mg | INTRAMUSCULAR | Status: DC | PRN
  Administered 2013-10-30 – 2013-11-01 (×3): 4 mg via INTRAVENOUS
  Filled 2013-10-30 (×3): qty 1

## 2013-10-30 NOTE — Progress Notes (Signed)
  Subjective: She says she is OK as long as she does not move, wants something to drink.    Objective: Vital signs in last 24 hours: Temp:  [98 F (36.7 C)-100.3 F (37.9 C)] 100.3 F (37.9 C) (09/24 0112) Pulse Rate:  [70-94] 94 (09/23 2208) Resp:  [16-18] 18 (09/24 0112) BP: (129-164)/(67-94) 132/67 mmHg (09/24 0112) SpO2:  [99 %-100 %] 99 % (09/24 0112) Weight:  [136.079 kg (300 lb)-146.058 kg (322 lb)] 146.058 kg (322 lb) (09/24 0112) Last BM Date: 10/28/13 Afebrile, VSS Lipase and LFT'S IMPROVING, wbc IS UP Intake/Output from previous day: 09/23 0701 - 09/24 0700 In: 351.7 [I.V.:351.7] Out: -  Intake/Output this shift:    General appearance: alert, cooperative and no distress GI: soft tender mid abdomen.   Lab Results:   Recent Labs  10/29/13 1519 10/30/13 0505  WBC 14.8* 13.7*  HGB 14.0 12.2  HCT 41.6 36.6  PLT 287 269    BMET  Recent Labs  10/29/13 1519 10/30/13 0505  NA 137 137  K 3.7 3.8  CL 100 102  CO2 24 25  GLUCOSE 106* 120*  BUN 9 9  CREATININE 0.81 0.84  CALCIUM 9.2 8.3*   PT/INR No results found for this basename: LABPROT, INR,  in the last 72 hours   Recent Labs Lab 10/29/13 1054 10/29/13 1519 10/30/13 0505  AST 516* 427* 285*  ALT 1083* 1028* 808*  ALKPHOS 219* 214* 163*  BILITOT 0.7 0.6 0.8  PROT 7.9 8.1 6.8  ALBUMIN 3.8 3.9 3.2*     Lipase     Component Value Date/Time   LIPASE 491* 10/30/2013 0505     Studies/Results: US Abdomen Complete  10/29/2013   CLINICAL DATA:  Abdominal pain.  EXAM: ULTRASOUND ABDOMEN COMPLETE  COMPARISON:  None.  FINDINGS: Gallbladder:  Numerous gallstones are noted in the gallbladder with associated acoustic shadowing. The gallbladder wall is mildly thickened. No pericholecystic fluid.  Common bile duct:  Diameter: 6.9 mm  Liver:  Normal echogenicity without focal lesions. No intrahepatic biliary dilatation. A small amount of perihepatic fluid is noted.  IVC:  Normal caliber.  Pancreas:   Limited visualization due to poor sonographic window.  Spleen:  Normal size and echogenicity without focal lesions.  Right Kidney:  Length: 11.2 cm. Normal renal cortical thickness and echogenicity without focal lesions or hydronephrosis.  Left Kidney:  Length: 12.3 cm. Normal renal cortical thickness and echogenicity without focal lesions or hydronephrosis.  Abdominal aorta:  Normal caliber.  Other findings:  None.  IMPRESSION: 1. Cholelithiasis and sonographic findings suggesting acute cholecystitis. There is also mild common bile duct dilatation. 2. Small amount of perihepatic fluid. 3. Limited visualization of the pancreas. 4. Normal spleen and kidneys.   Electronically Signed   By: Loralie Champagne M.D.   On: 10/29/2013 22:16    Medications: . heparin  5,000 Units Subcutaneous 3 times per day  . pantoprazole (PROTONIX) IV  40 mg Intravenous QHS    Assessment/Plan 1.  Gallstone pancreatitis 2.  Polycystic ovarian syndrome 3.  Body mass index is 47.5   Plan:  Ice chips, sips of clears, bowel rest, recheck labs in AM.  Npo after MN   LOS: 1 day    Madix Blowe 10/30/2013

## 2013-10-31 ENCOUNTER — Encounter (HOSPITAL_COMMUNITY): Admission: EM | Disposition: A | Discharge status: Home / Self Care | Payer: Self-pay | Source: Home / Self Care

## 2013-10-31 LAB — COMPREHENSIVE METABOLIC PANEL
ALBUMIN: 3.1 g/dL — AB (ref 3.5–5.2)
ALT: 616 U/L — ABNORMAL HIGH (ref 0–35)
ANION GAP: 13 (ref 5–15)
AST: 181 U/L — ABNORMAL HIGH (ref 0–37)
Alkaline Phosphatase: 149 U/L — ABNORMAL HIGH (ref 39–117)
BUN: 6 mg/dL (ref 6–23)
CALCIUM: 8.4 mg/dL (ref 8.4–10.5)
CO2: 24 mEq/L (ref 19–32)
CREATININE: 0.88 mg/dL (ref 0.50–1.10)
Chloride: 100 mEq/L (ref 96–112)
GFR calc non Af Amer: >90 mL/min (ref >90)
Glucose, Bld: 104 mg/dL — ABNORMAL HIGH (ref 70–99)
POTASSIUM: 3.6 meq/L — AB (ref 3.7–5.3)
Sodium: 137 mEq/L (ref 137–147)
TOTAL PROTEIN: 7.2 g/dL (ref 6.0–8.3)
Total Bilirubin: 0.7 mg/dL (ref 0.3–1.2)

## 2013-10-31 LAB — CBC
HCT: 37.3 % (ref 36.0–46.0)
Hemoglobin: 12.1 g/dL (ref 12.0–15.0)
MCH: 28.1 pg (ref 26.0–34.0)
MCHC: 32.4 g/dL (ref 30.0–36.0)
MCV: 86.7 fL (ref 78.0–100.0)
Platelets: 269 10*3/uL (ref 150–400)
RBC: 4.3 MIL/uL (ref 3.87–5.11)
RDW: 15.1 % (ref 11.5–15.5)
WBC: 13.4 10*3/uL — ABNORMAL HIGH (ref 4.0–10.5)

## 2013-10-31 LAB — LIPASE, BLOOD: LIPASE: 63 U/L — AB (ref 11–59)

## 2013-10-31 SURGERY — LAPAROSCOPIC CHOLECYSTECTOMY WITH INTRAOPERATIVE CHOLANGIOGRAM
Anesthesia: General

## 2013-10-31 MED ORDER — PIPERACILLIN-TAZOBACTAM 3.375 G IVPB
3.3750 g | Freq: Three times a day (TID) | INTRAVENOUS | Status: DC
  Administered 2013-10-31 – 2013-11-03 (×10): 3.375 g via INTRAVENOUS
  Filled 2013-10-31 (×11): qty 50

## 2013-10-31 MED ORDER — PIPERACILLIN-TAZOBACTAM 3.375 G IVPB: 3.3750 g | Freq: Three times a day (TID) | INTRAVENOUS | Status: DC

## 2013-10-31 MED ORDER — ACETAMINOPHEN 325 MG PO TABS
325.0000 mg | ORAL_TABLET | Freq: Four times a day (QID) | ORAL | Status: DC | PRN
  Administered 2013-10-31: 650 mg via ORAL
  Filled 2013-10-31: qty 2

## 2013-10-31 NOTE — Progress Notes (Signed)
Arrived to restart patient's peripheral IV but patient stating that the IV she has is not bothering her and she does not want me to attempt to restart her IV.  Staff RN notified.  Present IV site U

## 2013-10-31 NOTE — Progress Notes (Signed)
Patient ID: Erica Mayer, female   DOB: 09/29/1990, 23 y.o.   MRN: [id number]     [facility]      [address]   [place], [place] [address]    Phone: [phone] FAX: [phone]     Subjective: C/o pain. No N/V. Afebrile.  VSS.    Objective:  Vital signs:  Filed Vitals:   10/30/13 0112 10/30/13 1400 10/30/13 2215 10/31/13 0423  BP: 132/67 136/86 127/75 106/54  Pulse:  88 101 95  Temp: 100.3 F (37.9 C) 99.1 F (37.3 C) 100.2 F (37.9 C) 99.7 F (37.6 C)  TempSrc: Oral Oral Oral Oral  Resp: 18 18 18 18  Weight: 322 lb (146.058 kg)     SpO2: 99% 99% 100% 100%    Last BM Date: 10/28/13  Intake/Output   Yesterday:  09/24 0701 - 09/25 0700 In: 2400 [I.V.:2400] Out: -  This shift: I/O last 3 completed shifts: In: 2751.7 [I.V.:2751.7] Out: -     Physical Exam: General: Pt awake/alert/oriented x4 in no acute distress Chest: cta. No chest wall pain w good excursion CV:  Pulses intact.  Regular rhythm MS: Normal AROM mjr joints.  No obvious deformity Abdomen: Soft.  Nondistended. Mild TTP to RUQ and LUQ.  No evidence of peritonitis.  No incarcerated hernias. Ext:  SCDs BLE.  No mjr edema.  No cyanosis Skin: No petechiae / purpura   Problem List:   Active Problems:   Gallstone pancreatitis    Results:   Labs: Results for orders placed during the hospital encounter of 10/29/13 (from the past 48 hour(s))  CBC WITH DIFFERENTIAL     Status: Abnormal   Collection Time    10/29/13  3:19 PM      Result Value Ref Range   WBC 14.8 (*) 4.0 - 10.5 K/uL   RBC 4.82  3.87 - 5.11 MIL/uL   Hemoglobin 14.0  12.0 - 15.0 g/dL   HCT 41.6  36.0 - 46.0 %   MCV 86.3  78.0 - 100.0 fL   MCH 29.0  26.0 - 34.0 pg   MCHC 33.7  30.0 - 36.0 g/dL   RDW 15.0  11.5 - 15.5 %   Platelets 287  150 - 400 K/uL   Neutrophils Relative % 85 (*) 43 - 77 %   Neutro Abs 12.4 (*) 1.7 - 7.7 K/uL   Lymphocytes Relative 9 (*) 12 - 46 %   Lymphs Abs  1.4  0.7 - 4.0 K/uL   Monocytes Relative 6  3 - 12 %   Monocytes Absolute 0.9  0.1 - 1.0 K/uL   Eosinophils Relative 0  0 - 5 %   Eosinophils Absolute 0.0  0.0 - 0.7 K/uL   Basophils Relative 0  0 - 1 %   Basophils Absolute 0.0  0.0 - 0.1 K/uL  COMPREHENSIVE METABOLIC PANEL     Status: Abnormal   Collection Time    10/29/13  3:19 PM      Result Value Ref Range   Sodium 137  137 - 147 mEq/L   Potassium 3.7  3.7 - 5.3 mEq/L   Chloride 100  96 - 112 mEq/L   CO2 24  19 - 32 mEq/L   Glucose, Bld 106 (*) 70 - 99 mg/dL   BUN 9  6 - 23 mg/dL   Creatinine, Ser 0.81  0.50 - 1.10 mg/dL   Calcium 9.2  8.4 - 10.5 mg/dL   Total Protein   8.1  6.0 - 8.3 g/dL   Albumin 3.9  3.5 - 5.2 g/dL   AST 427 (*) 0 - 37 U/L   ALT 1028 (*) 0 - 35 U/L   Alkaline Phosphatase 214 (*) 39 - 117 U/L   Total Bilirubin 0.6  0.3 - 1.2 mg/dL   GFR calc non Af Amer >90  >90 mL/min   GFR calc Af Amer >90  >90 mL/min   Comment: (NOTE)     The eGFR has been calculated using the CKD EPI equation.     This calculation has not been validated in all clinical situations.     eGFR's persistently <90 mL/min signify possible Chronic Kidney     Disease.   Anion gap 13  5 - 15  LIPASE, BLOOD     Status: Abnormal   Collection Time    10/29/13  3:19 PM      Result Value Ref Range   Lipase 863 (*) 11 - 59 U/L  URINALYSIS, ROUTINE W REFLEX MICROSCOPIC     Status: Abnormal   Collection Time    10/29/13  4:57 PM      Result Value Ref Range   Color, Urine AMBER (*) YELLOW   Comment: BIOCHEMICALS MAY BE AFFECTED BY COLOR   APPearance CLEAR  CLEAR   Specific Gravity, Urine 1.031 (*) 1.005 - 1.030   pH 6.0  5.0 - 8.0   Glucose, UA NEGATIVE  NEGATIVE mg/dL   Hgb urine dipstick NEGATIVE  NEGATIVE   Bilirubin Urine SMALL (*) NEGATIVE   Ketones, ur >80 (*) NEGATIVE mg/dL   Protein, ur 30 (*) NEGATIVE mg/dL   Urobilinogen, UA 1.0  0.0 - 1.0 mg/dL   Nitrite NEGATIVE  NEGATIVE   Leukocytes, UA NEGATIVE  NEGATIVE  URINE  MICROSCOPIC-ADD ON     Status: Abnormal   Collection Time    10/29/13  4:57 PM      Result Value Ref Range   Squamous Epithelial / LPF FEW (*) RARE   WBC, UA 0-2  <3 WBC/hpf   Urine-Other MUCOUS PRESENT    POC URINE PREG, ED     Status: None   Collection Time    10/29/13  5:06 PM      Result Value Ref Range   Preg Test, Ur NEGATIVE  NEGATIVE   Comment:            THE SENSITIVITY OF THIS     METHODOLOGY IS >24 mIU/mL  COMPREHENSIVE METABOLIC PANEL     Status: Abnormal   Collection Time    10/30/13  5:05 AM      Result Value Ref Range   Sodium 137  137 - 147 mEq/L   Potassium 3.8  3.7 - 5.3 mEq/L   Chloride 102  96 - 112 mEq/L   CO2 25  19 - 32 mEq/L   Glucose, Bld 120 (*) 70 - 99 mg/dL   BUN 9  6 - 23 mg/dL   Creatinine, Ser 0.84  0.50 - 1.10 mg/dL   Calcium 8.3 (*) 8.4 - 10.5 mg/dL   Total Protein 6.8  6.0 - 8.3 g/dL   Albumin 3.2 (*) 3.5 - 5.2 g/dL   AST 285 (*) 0 - 37 U/L   ALT 808 (*) 0 - 35 U/L   Alkaline Phosphatase 163 (*) 39 - 117 U/L   Total Bilirubin 0.8  0.3 - 1.2 mg/dL   GFR calc non Af Amer >90  >90 mL/min   GFR calc  Af Amer >90  >90 mL/min   Comment: (NOTE)     The eGFR has been calculated using the CKD EPI equation.     This calculation has not been validated in all clinical situations.     eGFR's persistently <90 mL/min signify possible Chronic Kidney     Disease.   Anion gap 10  5 - 15  CBC     Status: Abnormal   Collection Time    10/30/13  5:05 AM      Result Value Ref Range   WBC 13.7 (*) 4.0 - 10.5 K/uL   RBC 4.25  3.87 - 5.11 MIL/uL   Hemoglobin 12.2  12.0 - 15.0 g/dL   HCT 36.6  36.0 - 46.0 %   MCV 86.1  78.0 - 100.0 fL   MCH 28.7  26.0 - 34.0 pg   MCHC 33.3  30.0 - 36.0 g/dL   RDW 15.2  11.5 - 15.5 %   Platelets 269  150 - 400 K/uL  LIPASE, BLOOD     Status: Abnormal   Collection Time    10/30/13  5:05 AM      Result Value Ref Range   Lipase 491 (*) 11 - 59 U/L  CBC     Status: Abnormal   Collection Time    10/31/13  5:04 AM       Result Value Ref Range   WBC 13.4 (*) 4.0 - 10.5 K/uL   RBC 4.30  3.87 - 5.11 MIL/uL   Hemoglobin 12.1  12.0 - 15.0 g/dL   HCT 37.3  36.0 - 46.0 %   MCV 86.7  78.0 - 100.0 fL   MCH 28.1  26.0 - 34.0 pg   MCHC 32.4  30.0 - 36.0 g/dL   RDW 15.1  11.5 - 15.5 %   Platelets 269  150 - 400 K/uL  COMPREHENSIVE METABOLIC PANEL     Status: Abnormal   Collection Time    10/31/13  5:04 AM      Result Value Ref Range   Sodium 137  137 - 147 mEq/L   Potassium 3.6 (*) 3.7 - 5.3 mEq/L   Chloride 100  96 - 112 mEq/L   CO2 24  19 - 32 mEq/L   Glucose, Bld 104 (*) 70 - 99 mg/dL   BUN 6  6 - 23 mg/dL   Creatinine, Ser 0.88  0.50 - 1.10 mg/dL   Calcium 8.4  8.4 - 10.5 mg/dL   Total Protein 7.2  6.0 - 8.3 g/dL   Albumin 3.1 (*) 3.5 - 5.2 g/dL   AST 181 (*) 0 - 37 U/L   ALT 616 (*) 0 - 35 U/L   Alkaline Phosphatase 149 (*) 39 - 117 U/L   Total Bilirubin 0.7  0.3 - 1.2 mg/dL   GFR calc non Af Amer >90  >90 mL/min   GFR calc Af Amer >90  >90 mL/min   Comment: (NOTE)     The eGFR has been calculated using the CKD EPI equation.     This calculation has not been validated in all clinical situations.     eGFR's persistently <90 mL/min signify possible Chronic Kidney     Disease.   Anion gap 13  5 - 15  LIPASE, BLOOD     Status: Abnormal   Collection Time    10/31/13  5:04 AM      Result Value Ref Range   Lipase 63 (*) 11 - 59 U/L      Imaging / Studies: US Abdomen Complete  10/29/2013   CLINICAL DATA:  Abdominal pain.  EXAM: ULTRASOUND ABDOMEN COMPLETE  COMPARISON:  None.  FINDINGS: Gallbladder:  Numerous gallstones are noted in the gallbladder with associated acoustic shadowing. The gallbladder wall is mildly thickened. No pericholecystic fluid.  Common bile duct:  Diameter: 6.9 mm  Liver:  Normal echogenicity without focal lesions. No intrahepatic biliary dilatation. A small amount of perihepatic fluid is noted.  IVC:  Normal caliber.  Pancreas:  Limited visualization due to poor sonographic window.   Spleen:  Normal size and echogenicity without focal lesions.  Right Kidney:  Length: 11.2 cm. Normal renal cortical thickness and echogenicity without focal lesions or hydronephrosis.  Left Kidney:  Length: 12.3 cm. Normal renal cortical thickness and echogenicity without focal lesions or hydronephrosis.  Abdominal aorta:  Normal caliber.  Other findings:  None.  IMPRESSION: 1. Cholelithiasis and sonographic findings suggesting acute cholecystitis. There is also mild common bile duct dilatation. 2. Small amount of perihepatic fluid. 3. Limited visualization of the pancreas. 4. Normal spleen and kidneys.   Electronically Signed   By: Kalman Jewels M.D.   On: 10/29/2013 22:16    Medications / Allergies:  Scheduled Meds: . heparin  5,000 Units Subcutaneous 3 times per day  . pantoprazole (PROTONIX) IV  40 mg Intravenous QHS   Continuous Infusions: . dextrose 5 % and 0.9 % NaCl with KCl 20 mEq/L 100 mL/hr at 10/30/13 0129   PRN Meds:.morphine injection, ondansetron  Antibiotics: Anti-infectives   None        Assessment/Plan Obesity PCOS Gallstone pancreatitis  Leukocytosis She remains tender.  I don't think she quite ready for surgery today.  Will cancel her surgery.  She is to remain NPO, IVF and pain control.  She has a white count, [place] suggesting acute cholecystitis and tender to RUQ.  ?should we start antibiotics  Erby Pian, [facility] Pager [phone]([id number])   10/31/2013 8:10 AM

## 2013-10-31 NOTE — Progress Notes (Signed)
Patient ID: Erica Mayer, female   DOB: 1990-08-07, 23 y.o.   MRN: [id number]  Fever of 101.2.  Give tylenol PRN.  UA on admission was okay.  No resp symptoms.  Start on Zosyn this AM for GB.  If fevers persist, will obtain BCx2 and a chest x ray.  [name], ANP-BC

## 2013-10-31 NOTE — Progress Notes (Addendum)
Patient seen and examined.  Still has moderate epigastric tenderness and leukocytosis.  Will start empiric Zosyn.  Cholecystectomy this admission.   I have explained the procedure, risks, and aftercare of cholecystectomy.  Risks include but are not limited to bleeding, infection, wound problems, anesthesia, diarrhea, bile leak, injury to common bile duct/liver/intestine.  she seems to understand and agrees with the plan.

## 2013-11-01 LAB — COMPREHENSIVE METABOLIC PANEL
ALK PHOS: 123 U/L — AB (ref 39–117)
ALT: 413 U/L — ABNORMAL HIGH (ref 0–35)
ANION GAP: 8 (ref 5–15)
AST: 86 U/L — AB (ref 0–37)
Albumin: 2.8 g/dL — ABNORMAL LOW (ref 3.5–5.2)
BILIRUBIN TOTAL: 0.6 mg/dL (ref 0.3–1.2)
BUN: 6 mg/dL (ref 6–23)
CHLORIDE: 103 meq/L (ref 96–112)
CO2: 26 mEq/L (ref 19–32)
Calcium: 8.3 mg/dL — ABNORMAL LOW (ref 8.4–10.5)
Creatinine, Ser: 0.9 mg/dL (ref 0.50–1.10)
GFR calc Af Amer: >90 mL/min (ref >90)
GFR calc non Af Amer: 89 mL/min — ABNORMAL LOW (ref >90)
Glucose, Bld: 107 mg/dL — ABNORMAL HIGH (ref 70–99)
POTASSIUM: 3.7 meq/L (ref 3.7–5.3)
Sodium: 137 mEq/L (ref 137–147)
Total Protein: 6.8 g/dL (ref 6.0–8.3)

## 2013-11-01 LAB — CBC
HCT: 33.7 % — ABNORMAL LOW (ref 36.0–46.0)
HEMOGLOBIN: 11.2 g/dL — AB (ref 12.0–15.0)
MCH: 28.6 pg (ref 26.0–34.0)
MCHC: 33.2 g/dL (ref 30.0–36.0)
MCV: 86.2 fL (ref 78.0–100.0)
PLATELETS: 245 10*3/uL (ref 150–400)
RBC: 3.91 MIL/uL (ref 3.87–5.11)
RDW: 14.9 % (ref 11.5–15.5)
WBC: 10.6 10*3/uL — AB (ref 4.0–10.5)

## 2013-11-01 LAB — SURGICAL PCR SCREEN
MRSA, PCR: NEGATIVE
Staphylococcus aureus: NEGATIVE

## 2013-11-01 LAB — LIPASE, BLOOD: LIPASE: 50 U/L (ref 11–59)

## 2013-11-01 NOTE — Progress Notes (Signed)
  Subjective: No n/v. Still with some supraumbilical pain. ambulating  Objective: Vital signs in last 24 hours: Temp:  [99.4 F (37.4 C)-101.2 F (38.4 C)] 99.4 F (37.4 C) (09/26 0609) Pulse Rate:  [81-89] 81 (09/26 0609) Resp:  [17-20] 20 (09/26 0609) BP: (129-135)/(62-84) 133/62 mmHg (09/26 0609) SpO2:  [98 %-100 %] 98 % (09/26 0609) Weight:  [325 lb 2.9 oz (147.5 kg)] 325 lb 2.9 oz (147.5 kg) (09/26 0834) Last BM Date: 10/28/13  Intake/Output from previous day: 09/25 0701 - 09/26 0700 In: 2403.3 [I.V.:2253.3; IV Piggyback:150] Out: -  Intake/Output this shift:    Alert, nontoxic, not ill appearing cta  Obese, soft, no RUQ TTP. No murphy sign. Some TTP in supraumbilical position.   Lab Results:   Recent Labs  10/31/13 0504 11/01/13 0520  WBC 13.4* 10.6*  HGB 12.1 11.2*  HCT 37.3 33.7*  PLT 269 245   BMET  Recent Labs  10/31/13 0504 11/01/13 0520  NA 137 137  K 3.6* 3.7  CL 100 103  CO2 24 26  GLUCOSE 104* 107*  BUN 6 6  CREATININE 0.88 0.90  CALCIUM 8.4 8.3*   Hepatic Function Latest Ref Rng 11/01/2013 10/31/2013 10/30/2013  Total Protein 6.0 - 8.3 g/dL 6.8 7.2 6.8  Albumin 3.5 - 5.2 g/dL 2.8(L) 3.1(L) 3.2(L)  AST 0 - 37 U/L 86(H) 181(H) 285(H)  ALT 0 - 35 U/L 413(H) 616(H) 808(H)  Alk Phosphatase 39 - 117 U/L 123(H) 149(H) 163(H)  Total Bilirubin 0.3 - 1.2 mg/dL 0.6 0.7 0.8     PT/INR No results found for this basename: LABPROT, INR,  in the last 72 hours ABG No results found for this basename: PHART, PCO2, PO2, HCO3,  in the last 72 hours  Studies/Results: No results found.  Anti-infectives: Anti-infectives   Start     Dose/Rate Route Frequency Ordered Stop   10/31/13 1000  piperacillin-tazobactam (ZOSYN) IVPB 3.375 g     3.375 g 12.5 mL/hr over 240 Minutes Intravenous 3 times per day 10/31/13 0910     10/31/13 0915  piperacillin-tazobactam (ZOSYN) IVPB 3.375 g  Status:  Discontinued     3.375 g 12.5 mL/hr over 240 Minutes Intravenous  3 times per day 10/31/13 0904 10/31/13 0908      Assessment/Plan: Obesity  PCOS  Gallstone pancreatitis  Leukocytosis  Wbc normalizing. LFTs trending down. 1 febrile episode yesterday but o/w no fevers. Still with tenderness. Will let cool off 1 more day.   Plan Lap Chole with Rico Sunday (baring any changes today)  Leighton Ruff. Redmond Pulling, MD, FACS General, Bariatric, & Minimally Invasive Surgery [facility], [place]   LOS: 3 days    Gayland Curry 11/01/2013

## 2013-11-02 ENCOUNTER — Encounter (HOSPITAL_COMMUNITY): Admission: EM | Disposition: A | Discharge status: Home / Self Care | Payer: Self-pay | Source: Home / Self Care

## 2013-11-02 ENCOUNTER — Encounter (HOSPITAL_COMMUNITY): Payer: Self-pay | Admitting: Anesthesiology

## 2013-11-02 ENCOUNTER — Inpatient Hospital Stay (HOSPITAL_COMMUNITY): Payer: No Typology Code available for payment source

## 2013-11-02 ENCOUNTER — Inpatient Hospital Stay (HOSPITAL_COMMUNITY): Payer: No Typology Code available for payment source | Admitting: Anesthesiology

## 2013-11-02 ENCOUNTER — Encounter (HOSPITAL_COMMUNITY): Payer: No Typology Code available for payment source | Admitting: Anesthesiology

## 2013-11-02 HISTORY — PX: CHOLECYSTECTOMY: SHX55

## 2013-11-02 LAB — COMPREHENSIVE METABOLIC PANEL
ALBUMIN: 2.7 g/dL — AB (ref 3.5–5.2)
ALT: 305 U/L — ABNORMAL HIGH (ref 0–35)
ANION GAP: 10 (ref 5–15)
AST: 69 U/L — ABNORMAL HIGH (ref 0–37)
Alkaline Phosphatase: 122 U/L — ABNORMAL HIGH (ref 39–117)
BUN: 7 mg/dL (ref 6–23)
CALCIUM: 8.3 mg/dL — AB (ref 8.4–10.5)
CO2: 25 mEq/L (ref 19–32)
CREATININE: 0.93 mg/dL (ref 0.50–1.10)
Chloride: 102 mEq/L (ref 96–112)
GFR calc Af Amer: >90 mL/min (ref >90)
GFR calc non Af Amer: 85 mL/min — ABNORMAL LOW (ref >90)
Glucose, Bld: 96 mg/dL (ref 70–99)
Potassium: 3.9 mEq/L (ref 3.7–5.3)
Sodium: 137 mEq/L (ref 137–147)
TOTAL PROTEIN: 6.8 g/dL (ref 6.0–8.3)
Total Bilirubin: 0.6 mg/dL (ref 0.3–1.2)

## 2013-11-02 LAB — CBC
HCT: 32.5 % — ABNORMAL LOW (ref 36.0–46.0)
Hemoglobin: 10.8 g/dL — ABNORMAL LOW (ref 12.0–15.0)
MCH: 28.9 pg (ref 26.0–34.0)
MCHC: 33.2 g/dL (ref 30.0–36.0)
MCV: 86.9 fL (ref 78.0–100.0)
Platelets: 242 10*3/uL (ref 150–400)
RBC: 3.74 MIL/uL — ABNORMAL LOW (ref 3.87–5.11)
RDW: 14.5 % (ref 11.5–15.5)
WBC: 9 10*3/uL (ref 4.0–10.5)

## 2013-11-02 LAB — LIPASE, BLOOD: LIPASE: 43 U/L (ref 11–59)

## 2013-11-02 SURGERY — LAPAROSCOPIC CHOLECYSTECTOMY WITH INTRAOPERATIVE CHOLANGIOGRAM
Anesthesia: General | Site: Abdomen

## 2013-11-02 MED ORDER — KCL IN DEXTROSE-NACL 20-5-0.9 MEQ/L-%-% IV SOLN
INTRAVENOUS | Status: AC
  Filled 2013-11-02: qty 1000

## 2013-11-02 MED ORDER — SUCCINYLCHOLINE CHLORIDE 20 MG/ML IJ SOLN
INTRAMUSCULAR | Status: DC | PRN
  Administered 2013-11-02: 140 mg via INTRAVENOUS

## 2013-11-02 MED ORDER — OXYCODONE HCL 5 MG PO TABS: 5.0000 mg | ORAL_TABLET | ORAL | Status: DC | PRN

## 2013-11-02 MED ORDER — LIDOCAINE HCL (CARDIAC) 20 MG/ML IV SOLN
INTRAVENOUS | Status: DC | PRN
  Administered 2013-11-02: 100 mg via INTRAVENOUS

## 2013-11-02 MED ORDER — ONDANSETRON HCL 4 MG/2ML IJ SOLN
INTRAMUSCULAR | Status: AC
  Filled 2013-11-02: qty 2

## 2013-11-02 MED ORDER — MIDAZOLAM HCL 2 MG/2ML IJ SOLN
INTRAMUSCULAR | Status: AC
  Filled 2013-11-02: qty 2

## 2013-11-02 MED ORDER — SODIUM CHLORIDE 0.9 % IJ SOLN
INTRAMUSCULAR | Status: AC
  Filled 2013-11-02: qty 10

## 2013-11-02 MED ORDER — BUPIVACAINE HCL (PF) 0.5 % IJ SOLN
INTRAMUSCULAR | Status: AC
  Filled 2013-11-02: qty 30

## 2013-11-02 MED ORDER — DEXAMETHASONE SODIUM PHOSPHATE 10 MG/ML IJ SOLN
INTRAMUSCULAR | Status: DC | PRN
  Administered 2013-11-02: 10 mg via INTRAVENOUS

## 2013-11-02 MED ORDER — MIDAZOLAM HCL 5 MG/5ML IJ SOLN
INTRAMUSCULAR | Status: DC | PRN
  Administered 2013-11-02: 2 mg via INTRAVENOUS

## 2013-11-02 MED ORDER — PROPOFOL 10 MG/ML IV BOLUS
INTRAVENOUS | Status: AC
  Filled 2013-11-02: qty 20

## 2013-11-02 MED ORDER — EPHEDRINE SULFATE 50 MG/ML IJ SOLN
INTRAMUSCULAR | Status: AC
  Filled 2013-11-02: qty 1

## 2013-11-02 MED ORDER — NEOSTIGMINE METHYLSULFATE 10 MG/10ML IV SOLN
INTRAVENOUS | Status: AC
  Filled 2013-11-02: qty 1

## 2013-11-02 MED ORDER — LIDOCAINE HCL (CARDIAC) 20 MG/ML IV SOLN
INTRAVENOUS | Status: AC
  Filled 2013-11-02: qty 5

## 2013-11-02 MED ORDER — HYDROMORPHONE HCL 1 MG/ML IJ SOLN: 0.2500 mg | INTRAMUSCULAR | Status: DC | PRN

## 2013-11-02 MED ORDER — DEXAMETHASONE SODIUM PHOSPHATE 10 MG/ML IJ SOLN
INTRAMUSCULAR | Status: AC
  Filled 2013-11-02: qty 1

## 2013-11-02 MED ORDER — BUPIVACAINE HCL 0.5 % IJ SOLN
INTRAMUSCULAR | Status: DC | PRN
  Administered 2013-11-02: 10 mL

## 2013-11-02 MED ORDER — LACTATED RINGERS IR SOLN
Status: DC | PRN
  Administered 2013-11-02: 1

## 2013-11-02 MED ORDER — 0.9 % SODIUM CHLORIDE (POUR BTL) OPTIME
TOPICAL | Status: DC | PRN
  Administered 2013-11-02: 1000 mL

## 2013-11-02 MED ORDER — HYDROMORPHONE HCL 1 MG/ML IJ SOLN
INTRAMUSCULAR | Status: AC
  Filled 2013-11-02: qty 1

## 2013-11-02 MED ORDER — LACTATED RINGERS IV SOLN: INTRAVENOUS | Status: DC

## 2013-11-02 MED ORDER — FENTANYL CITRATE 0.05 MG/ML IJ SOLN
INTRAMUSCULAR | Status: DC | PRN
  Administered 2013-11-02: 100 ug via INTRAVENOUS

## 2013-11-02 MED ORDER — GLYCOPYRROLATE 0.2 MG/ML IJ SOLN
INTRAMUSCULAR | Status: DC | PRN
  Administered 2013-11-02: 0.6 mg via INTRAVENOUS

## 2013-11-02 MED ORDER — HEPARIN SODIUM (PORCINE) 5000 UNIT/ML IJ SOLN
5000.0000 [IU] | Freq: Three times a day (TID) | INTRAMUSCULAR | Status: AC
  Administered 2013-11-03 – 2013-11-06 (×10): 5000 [IU] via SUBCUTANEOUS
  Filled 2013-11-02 (×10): qty 1

## 2013-11-02 MED ORDER — ROCURONIUM BROMIDE 100 MG/10ML IV SOLN
INTRAVENOUS | Status: DC | PRN
  Administered 2013-11-02: 5 mg via INTRAVENOUS
  Administered 2013-11-02: 30 mg via INTRAVENOUS
  Administered 2013-11-02: 10 mg via INTRAVENOUS

## 2013-11-02 MED ORDER — ROCURONIUM BROMIDE 100 MG/10ML IV SOLN
INTRAVENOUS | Status: AC
  Filled 2013-11-02: qty 1

## 2013-11-02 MED ORDER — PROPOFOL 10 MG/ML IV BOLUS
INTRAVENOUS | Status: DC | PRN
  Administered 2013-11-02: 200 mg via INTRAVENOUS

## 2013-11-02 MED ORDER — MORPHINE SULFATE 2 MG/ML IJ SOLN
2.0000 mg | INTRAMUSCULAR | Status: DC | PRN
  Administered 2013-11-02 (×2): 2 mg via INTRAVENOUS
  Filled 2013-11-02 (×2): qty 1

## 2013-11-02 MED ORDER — ONDANSETRON HCL 4 MG/2ML IJ SOLN
INTRAMUSCULAR | Status: DC | PRN
  Administered 2013-11-02: 4 mg via INTRAVENOUS

## 2013-11-02 MED ORDER — NEOSTIGMINE METHYLSULFATE 10 MG/10ML IV SOLN
INTRAVENOUS | Status: DC | PRN
  Administered 2013-11-02: 4 mg via INTRAVENOUS

## 2013-11-02 MED ORDER — FENTANYL CITRATE 0.05 MG/ML IJ SOLN
INTRAMUSCULAR | Status: AC
  Filled 2013-11-02: qty 5

## 2013-11-02 MED ORDER — GLYCOPYRROLATE 0.2 MG/ML IJ SOLN
INTRAMUSCULAR | Status: AC
  Filled 2013-11-02: qty 3

## 2013-11-02 MED ORDER — LACTATED RINGERS IV SOLN
INTRAVENOUS | Status: DC | PRN
  Administered 2013-11-02: 08:00:00 via INTRAVENOUS

## 2013-11-02 MED ORDER — ONDANSETRON HCL 4 MG/2ML IJ SOLN: 4.0000 mg | INTRAMUSCULAR | Status: DC | PRN

## 2013-11-02 MED ORDER — IOHEXOL 300 MG/ML  SOLN
INTRAMUSCULAR | Status: DC | PRN
  Administered 2013-11-02: 6 mL

## 2013-11-02 SURGICAL SUPPLY — 38 items
APPLIER CLIP 5 13 M/L LIGAMAX5 (MISCELLANEOUS) ×2
BENZOIN TINCTURE PRP APPL 2/3 (GAUZE/BANDAGES/DRESSINGS) ×2 IMPLANT
CHLORAPREP W/TINT 26ML (MISCELLANEOUS) ×2 IMPLANT
CLIP APPLIE 5 13 M/L LIGAMAX5 (MISCELLANEOUS) ×1 IMPLANT
CLOSURE STERI-STRIP 1/4X4 (GAUZE/BANDAGES/DRESSINGS) ×2 IMPLANT
COVER MAYO STAND STRL (DRAPES) ×2 IMPLANT
DECANTER SPIKE VIAL GLASS SM (MISCELLANEOUS) ×2 IMPLANT
DISSECTOR BLUNT TIP ENDO 5MM (MISCELLANEOUS) IMPLANT
DRAPE C-ARM 42X120 X-RAY (DRAPES) ×2 IMPLANT
DRAPE LAPAROSCOPIC ABDOMINAL (DRAPES) ×2 IMPLANT
DRAPE UTILITY XL STRL (DRAPES) ×2 IMPLANT
DRSG TEGADERM 2-3/8X2-3/4 SM (GAUZE/BANDAGES/DRESSINGS) ×2 IMPLANT
ELECT REM PT RETURN 9FT ADLT (ELECTROSURGICAL) ×2
ELECTRODE REM PT RTRN 9FT ADLT (ELECTROSURGICAL) ×1 IMPLANT
ENDOLOOP SUT PDS II  0 18 (SUTURE)
ENDOLOOP SUT PDS II 0 18 (SUTURE) IMPLANT
GAUZE SPONGE 2X2 8PLY STRL LF (GAUZE/BANDAGES/DRESSINGS) ×1 IMPLANT
GLOVE ECLIPSE 8.0 STRL XLNG CF (GLOVE) ×2 IMPLANT
GLOVE INDICATOR 8.0 STRL GRN (GLOVE) ×2 IMPLANT
GOWN STRL REUS W/TWL XL LVL3 (GOWN DISPOSABLE) ×6 IMPLANT
HEMOSTAT SNOW SURGICEL 2X4 (HEMOSTASIS) IMPLANT
KIT BASIN OR (CUSTOM PROCEDURE TRAY) ×2 IMPLANT
POUCH SPECIMEN RETRIEVAL 10MM (ENDOMECHANICALS) ×2 IMPLANT
SCISSORS LAP 5X35 DISP (ENDOMECHANICALS) ×2 IMPLANT
SET CHOLANGIOGRAPH MIX (MISCELLANEOUS) ×2 IMPLANT
SET IRRIG TUBING LAPAROSCOPIC (IRRIGATION / IRRIGATOR) ×2 IMPLANT
SLEEVE XCEL OPT CAN 5 100 (ENDOMECHANICALS) ×4 IMPLANT
SOLUTION ANTI FOG 6CC (MISCELLANEOUS) ×2 IMPLANT
SPONGE GAUZE 2X2 STER 10/PKG (GAUZE/BANDAGES/DRESSINGS) ×1
STRIP CLOSURE SKIN 1/2X4 (GAUZE/BANDAGES/DRESSINGS) ×2 IMPLANT
SUT MNCRL AB 4-0 PS2 18 (SUTURE) ×2 IMPLANT
TOWEL OR 17X26 10 PK STRL BLUE (TOWEL DISPOSABLE) ×2 IMPLANT
TOWEL OR NON WOVEN STRL DISP B (DISPOSABLE) ×2 IMPLANT
TRAY LAP CHOLE (CUSTOM PROCEDURE TRAY) ×2 IMPLANT
TROCAR BLADELESS OPT 5 100 (ENDOMECHANICALS) ×2 IMPLANT
TROCAR XCEL BLUNT TIP 100MML (ENDOMECHANICALS) ×2 IMPLANT
TROCAR XCEL NON-BLD 11X100MML (ENDOMECHANICALS) IMPLANT
TUBING INSUFFLATION 10FT LAP (TUBING) ×2 IMPLANT

## 2013-11-02 NOTE — Op Note (Signed)
Preoperative diagnosis:  Gallstone pancreatitis  Postoperative diagnosis:  Same  Procedure: Laparoscopic cholecystectomy with cholangiogram.  Surgeon: Avel Peace, M.D.  Asst.:  Gaynelle Adu M.D.  Anesthesia: General  Indication:   Erica Mayer is a 23 year old female admitted with gallstone pancreatitis and has clinically improved.  She now presents for the above procedure.  Technique: She was brought to the operating room, placed supine on the operating table, and a general anesthetic was given.The abdominal wall was then sterilely prepped and draped. Local anesthetic (Marcaine) was infiltrated in the subumbilical region. A small subumbilical incision was made through the skin, subcutaneous tissue, fascia, and peritoneum entering the peritoneal cavity under direct vision. A pursestring suture of 0 Vicryl was placed around the edges of the fascia. A Hassan trocar was introduced into the peritoneal cavity and a pneumoperitoneum was created by insufflation of carbon dioxide gas. The laparoscope was introduced into the trocar and no underlying bleeding or organ injury was noted. She was then placed in the reverse Trendelenburg position with the right side tilted slightly up.  Three 5 mm trocars were then placed into the abdominal cavity under laparoscopic vision. One in the epigastric area, and 2 in the right upper quadrant area. The gallbladder was visualized and the fundus was grasped and retracted toward the right shoulder.  Mild acute inflammatory changes were noted. The infundibulum was mobilized with dissection close to the gallbladder and retracted laterally. The cystic duct was identified and a window was created around it. The cystic artery was also identified and a window was created around it. The critical view was achieved. A clip was placed at the neck of the gallbladder. A small incision was made in the cystic duct. A cholangiocatheter was introduced through the anterior abdominal wall  and placed in the cystic duct. A intraoperative cholangiogram was then performed.  Under real-time fluoroscopy, dilute contrast was injected into the cystic duct.  The common hepatic duct, the right and left hepatic ducts, and the common duct were all visualized. Contrast drained into the duodenum with a slight irregularity in the distal common bile duct the may be an artifact vs real finding after discussion with the radiologist.  There was no delay or obstruction of flow of contrast into the duodenum.  The cholangiocatheter was removed, the cystic duct was clipped 3 times on the biliary side, and then the cystic duct was divided sharply. No bile leak was noted from the cystic duct stump.  The cystic artery was then clipped and divided. Following this the gallbladder was dissected free from the liver using electrocautery. The gallbladder was then placed in a retrieval bag and removed from the abdominal cavity through the subumbilical incision.  The gallbladder fossa was inspected, irrigated, and bleeding was controlled with electrocautery. Inspection showed that hemostasis was adequate and there was no evidence of bile leak.  The irrigation fluid was evacuated as much as possible.  The subumbilical trocar was removed and the fascial defect was closed by tightening and tying down the pursestring suture under laparoscopic vision.  The remaining trocars were removed and the pneumoperitoneum was released. The skin incisions were closed with 4-0 Monocryl subcuticular stitches. Steri-Strips and sterile dressings were applied.  The procedure was well-tolerated without any apparent complications. She was taken to the recovery room in satisfactory condition.

## 2013-11-02 NOTE — Anesthesia Preprocedure Evaluation (Addendum)
Anesthesia Evaluation  Patient identified by MRN, date of birth, ID band Patient awake    Reviewed: Allergy & Precautions, H&P , NPO status , Patient's Chart, lab work & pertinent test results  Airway Mallampati: II TM Distance: >3 FB Neck ROM: full    Dental no notable dental hx. (+) Teeth Intact, Dental Advisory Given   Pulmonary neg pulmonary ROS, former smoker,  breath sounds clear to auscultation  Pulmonary exam normal       Cardiovascular Exercise Tolerance: Good negative cardio ROS  Rhythm:regular Rate:Normal     Neuro/Psych negative neurological ROS  negative psych ROS   GI/Hepatic negative GI ROS, Neg liver ROS,   Endo/Other  negative endocrine ROSMorbid obesity  Renal/GU negative Renal ROS  negative genitourinary   Musculoskeletal   Abdominal (+) + obese,   Peds  Hematology negative hematology ROS (+)   Anesthesia Other Findings   Reproductive/Obstetrics negative OB ROS                         Anesthesia Physical Anesthesia Plan  ASA: II  Anesthesia Plan: General   Post-op Pain Management:    Induction: Intravenous  Airway Management Planned: Oral ETT  Additional Equipment:   Intra-op Plan:   Post-operative Plan: Extubation in OR  Informed Consent: I have reviewed the patients History and Physical, chart, labs and discussed the procedure including the risks, benefits and alternatives for the proposed anesthesia with the patient or authorized representative who has indicated his/her understanding and acceptance.   Dental Advisory Given  Plan Discussed with: CRNA and Surgeon  Anesthesia Plan Comments:        Anesthesia Quick Evaluation

## 2013-11-02 NOTE — Progress Notes (Signed)
Day of Surgery  Subjective: Feels better today.  No significant pain.  Objective: Vital signs in last 24 hours: Temp:  [98.5 F (36.9 C)-99.2 F (37.3 C)] 98.5 F (36.9 C) (09/27 0501) Pulse Rate:  [70-84] 70 (09/27 0501) Resp:  [16-18] 16 (09/27 0501) BP: (122-141)/(65-76) 122/66 mmHg (09/27 0501) SpO2:  [98 %-100 %] 100 % (09/27 0501) Weight:  [325 lb 2.9 oz (147.5 kg)] 325 lb 2.9 oz (147.5 kg) (09/26 0834) Last BM Date: 10/28/13  Intake/Output from previous day:   Intake/Output this shift:    PE: General- In NAD Abdomen-soft, not tender this AM  Lab Results:   Recent Labs  11/01/13 0520 11/02/13 0535  WBC 10.6* 9.0  HGB 11.2* 10.8*  HCT 33.7* 32.5*  PLT 245 242   BMET  Recent Labs  11/01/13 0520 11/02/13 0535  NA 137 137  K 3.7 3.9  CL 103 102  CO2 26 25  GLUCOSE 107* 96  BUN 6 7  CREATININE 0.90 0.93  CALCIUM 8.3* 8.3*   PT/INR No results found for this basename: LABPROT, INR,  in the last 72 hours Comprehensive Metabolic Panel:    Component Value Date/Time   NA 137 11/02/2013 0535   NA 137 11/01/2013 0520   K 3.9 11/02/2013 0535   K 3.7 11/01/2013 0520   CL 102 11/02/2013 0535   CL 103 11/01/2013 0520   CO2 25 11/02/2013 0535   CO2 26 11/01/2013 0520   BUN 7 11/02/2013 0535   BUN 6 11/01/2013 0520   CREATININE 0.93 11/02/2013 0535   CREATININE 0.90 11/01/2013 0520   GLUCOSE 96 11/02/2013 0535   GLUCOSE 107* 11/01/2013 0520   CALCIUM 8.3* 11/02/2013 0535   CALCIUM 8.3* 11/01/2013 0520   AST 69* 11/02/2013 0535   AST 86* 11/01/2013 0520   ALT 305* 11/02/2013 0535   ALT 413* 11/01/2013 0520   ALKPHOS 122* 11/02/2013 0535   ALKPHOS 123* 11/01/2013 0520   BILITOT 0.6 11/02/2013 0535   BILITOT 0.6 11/01/2013 0520   PROT 6.8 11/02/2013 0535   PROT 6.8 11/01/2013 0520   ALBUMIN 2.7* 11/02/2013 0535   ALBUMIN 2.8* 11/01/2013 0520     Studies/Results: No results found.  Anti-infectives: Anti-infectives   Start     Dose/Rate Route Frequency Ordered Stop   10/31/13 1000  piperacillin-tazobactam (ZOSYN) IVPB 3.375 g     3.375 g 12.5 mL/hr over 240 Minutes Intravenous 3 times per day 10/31/13 0910     10/31/13 0915  piperacillin-tazobactam (ZOSYN) IVPB 3.375 g  Status:  Discontinued     3.375 g 12.5 mL/hr over 240 Minutes Intravenous 3 times per day 10/31/13 0904 10/31/13 0908      Assessment Principal Problem:   Gallstone pancreatitis-clinically improved; lipase normal; LFTs normalizing.    LOS: 4 days   Plan: Cholecystectomy today.   Erica Mayer Shela Commons 11/02/2013

## 2013-11-02 NOTE — Anesthesia Postprocedure Evaluation (Signed)
  Anesthesia Post-op Note  Patient: Erica Mayer  Procedure(s) Performed: Procedure(s) (LRB): LAPAROSCOPIC CHOLECYSTECTOMY WITH INTRAOPERATIVE CHOLANGIOGRAM (N/A)  Patient Location: PACU  Anesthesia Type: General  Level of Consciousness: awake and alert   Airway and Oxygen Therapy: Patient Spontanous Breathing  Post-op Pain: mild  Post-op Assessment: Post-op Vital signs reviewed, Patient's Cardiovascular Status Stable, Respiratory Function Stable, Patent Airway and No signs of Nausea or vomiting  Last Vitals:  Filed Vitals:   11/02/13 1055  BP:   Pulse:   Temp: 37.1 C  Resp:     Post-op Vital Signs: stable   Complications: No apparent anesthesia complications

## 2013-11-02 NOTE — Transfer of Care (Signed)
Immediate Anesthesia Transfer of Care Note  Patient: Erica Mayer  Procedure(s) Performed: Procedure(s): LAPAROSCOPIC CHOLECYSTECTOMY WITH INTRAOPERATIVE CHOLANGIOGRAM (N/A)  Patient Location: PACU  Anesthesia Type:General  Level of Consciousness: awake, alert  and oriented  Airway & Oxygen Therapy: Patient Spontanous Breathing and Patient connected to nasal cannula oxygen  Post-op Assessment: Report given to PACU RN and Post -op Vital signs reviewed and stable  Post vital signs: Reviewed and stable  Complications: No apparent anesthesia complications

## 2013-11-02 NOTE — Discharge Instructions (Signed)
[facility]: POST OP INSTRUCTIONS Always review your discharge instruction sheet given to you by the facility where your surgery was performed. IF YOU HAVE DISABILITY OR FAMILY LEAVE FORMS, YOU MUST BRING THEM TO THE OFFICE FOR PROCESSING.   DO NOT GIVE THEM TO YOUR DOCTOR.  1. A prescription for pain medication may be given to you upon discharge.  Take your pain medication as prescribed, if needed.  If narcotic pain medicine is not needed, then you may take acetaminophen (Tylenol) or ibuprofen (Advil) as needed. 2. Take your usually prescribed medications unless otherwise directed. 3. If you need a refill on your pain medication, please contact your pharmacy.  They will contact our office to request authorization. Prescriptions will not be filled after 5pm or on week-ends. 4. You should follow a strict lowfat diet the after arrival home..  Be sure to include lots of fluids daily. 5. Most patients will experience some swelling and bruising in the area of the incisions.  Ice packs will help.  Swelling and bruising can take several days to resolve.  6. It is common to experience some constipation if taking pain medication after surgery.  Increasing fluid intake and taking a stool softener (such as Colace) will usually help or prevent this problem from occurring.  A mild laxative (Milk of Magnesia or Miralax) should be taken according to package instructions if there are no bowel movements after 48 hours. 7. Unless discharge instructions indicate otherwise, you may remove your bandages 72 hours after surgery, and you may shower at that time.  You may have steri-strips (small skin tapes) in place directly over the incision.  These strips should be left on the skin for 14 days.  If your surgeon used skin glue on the incision, you may shower in 24 hours.  The glue will flake off over the next 2-3 weeks.  Any sutures or staples will be removed at the office  during your follow-up visit. 8. ACTIVITIES:  You may resume regular (light) daily activities beginning the next day--such as daily self-care, walking, climbing stairs--gradually increasing activities as tolerated.  You may have sexual intercourse when it is comfortable.  Refrain from any heavy lifting or straining-nothing over 10 pounds for two weeks.  a. You may drive when you are no longer taking prescription pain medication, you can comfortably wear a seatbelt, and you can safely maneuver your car and apply brakes. b. RETURN TO WORK:  In 2 weeks.__________________________________________________________ 9. You should see your doctor in the office for a follow-up appointment approximately 2-3 weeks after your surgery.  Make sure that you call for this appointment within a day or two after you arrive home to insure a convenient appointment time. 10. OTHER INSTRUCTIONS: __________________________________________________________________________________________________________________________ __________________________________________________________________________________________________________________________ WHEN TO CALL YOUR DOCTOR: 1. Fever over 101.0 2. Inability to urinate 3. Continued bleeding from incision. 4. Increased pain, redness, or drainage from the incision. 5. Increasing abdominal pain  The clinic staff is available to answer your questions during regular business hours.  Please dont hesitate to call and ask to speak to one of the nurses for clinical concerns.  If you have a medical emergency, go to the nearest emergency room or call 911.  A surgeon from [facility] is always on call at the hospital. [address], [address], [place], [place]  [address] ? [address] [address], [place], [place]   [address] [phone] ? [phone] ? FAX [phone] Web site: [email or web address]

## 2013-11-03 ENCOUNTER — Encounter (HOSPITAL_COMMUNITY): Payer: Self-pay | Admitting: General Surgery

## 2013-11-03 LAB — COMPREHENSIVE METABOLIC PANEL
ALBUMIN: 2.8 g/dL — AB (ref 3.5–5.2)
ALT: 329 U/L — ABNORMAL HIGH (ref 0–35)
ANION GAP: 11 (ref 5–15)
AST: 126 U/L — ABNORMAL HIGH (ref 0–37)
Alkaline Phosphatase: 127 U/L — ABNORMAL HIGH (ref 39–117)
BILIRUBIN TOTAL: 0.3 mg/dL (ref 0.3–1.2)
BUN: 7 mg/dL (ref 6–23)
CO2: 22 mEq/L (ref 19–32)
CREATININE: 0.75 mg/dL (ref 0.50–1.10)
Calcium: 8.6 mg/dL (ref 8.4–10.5)
Chloride: 105 mEq/L (ref 96–112)
GFR calc non Af Amer: >90 mL/min (ref >90)
GLUCOSE: 130 mg/dL — AB (ref 70–99)
Potassium: 4.3 mEq/L (ref 3.7–5.3)
Sodium: 138 mEq/L (ref 137–147)
Total Protein: 7 g/dL (ref 6.0–8.3)

## 2013-11-03 MED ORDER — SODIUM CHLORIDE 0.9 % IJ SOLN
3.0000 mL | Freq: Two times a day (BID) | INTRAMUSCULAR | Status: DC
  Administered 2013-11-04 – 2013-11-06 (×4): 3 mL via INTRAVENOUS

## 2013-11-03 MED ORDER — OXYCODONE HCL 5 MG PO TABS
5.0000 mg | ORAL_TABLET | ORAL | Status: DC | PRN
  Administered 2013-11-03: 10 mg via ORAL
  Filled 2013-11-03: qty 2

## 2013-11-03 MED ORDER — PHENOL 1.4 % MT LIQD
1.0000 | OROMUCOSAL | Status: DC | PRN
  Administered 2013-11-03: 1 via OROMUCOSAL
  Filled 2013-11-03: qty 177

## 2013-11-03 MED ORDER — SODIUM CHLORIDE 0.9 % IJ SOLN: 3.0000 mL | INTRAMUSCULAR | Status: DC | PRN

## 2013-11-03 NOTE — Progress Notes (Signed)
Patient ID: Erica Mayer, female   DOB: 1990/07/01, 23 y.o.   MRN: [id number]     [facility]      [address] [place] [place], [place] [address]    Phone: [phone] FAX: [phone]     Subjective: Passing flatus, voiding, ambulating, tolerating PO.  No n/v.  Some soreness.  VSS. Afebrile.    Objective:  Vital signs:  Filed Vitals:   11/02/13 1055 11/02/13 1107 11/02/13 2133 11/03/13 0526  BP:  126/45 97/73 112/67  Pulse:  81 73 60  Temp: 98.7 F (37.1 C) 99.1 F (37.3 C) 98.7 F (37.1 C) 97.8 F (36.6 C)  TempSrc:   Oral Oral  Resp:  20 20 20   Height:      Weight:      SpO2: 100% 98% 100% 98%    Last BM Date: 10/28/13  Intake/Output   Yesterday:  09/27 0701 - 09/28 0700 In: 3646 [P.O.:720; I.V.:2850; IV Piggyback:50] Out: 10 [Blood:10] This shift:    I/O last 3 completed shifts: In: 3620 [P.O.:720; I.V.:2850; IV Piggyback:50] Out: 10 [Blood:10]    Physical Exam: General: Pt awake/alert/oriented x4 in no acute distress Chest: cta.  No chest wall pain w good excursion CV:  Pulses intact.  Regular rhythm Abdomen: Soft.  Nondistended. Mildly tender at incisions only.  Incisions are c/d/i. No evidence of peritonitis.  No incarcerated hernias. Ext:  SCDs BLE.  No mjr edema.  No cyanosis Skin: No petechiae / purpura   Problem List:   Principal Problem:   Gallstone pancreatitis    Results:   Labs: Results for orders placed during the hospital encounter of 10/29/13 (from the past 48 hour(s))  SURGICAL PCR SCREEN     Status: None   Collection Time    11/01/13  7:33 PM      Result Value Ref Range   MRSA, PCR NEGATIVE  NEGATIVE   Staphylococcus aureus NEGATIVE  NEGATIVE   Comment:            The Xpert SA Assay (FDA     approved for NASAL specimens     in patients over 64 years of age),     is one component of     a comprehensive surveillance     program.  Test performance has     been validated by [employer] for patients greater     than or equal to 34 year old.     It is not intended     to diagnose infection nor to     guide or monitor treatment.  CBC     Status: Abnormal   Collection Time    11/02/13  5:35 AM      Result Value Ref Range   WBC 9.0  4.0 - 10.5 K/uL   RBC 3.74 (*) 3.87 - 5.11 MIL/uL   Hemoglobin 10.8 (*) 12.0 - 15.0 g/dL   HCT 32.5 (*) 36.0 - 46.0 %   MCV 86.9  78.0 - 100.0 fL   MCH 28.9  26.0 - 34.0 pg   MCHC 33.2  30.0 - 36.0 g/dL   RDW 14.5  11.5 - 15.5 %   Platelets 242  150 - 400 K/uL  COMPREHENSIVE METABOLIC PANEL     Status: Abnormal   Collection Time    11/02/13  5:35 AM      Result Value Ref Range   Sodium 137  137 - 147 mEq/L  Potassium 3.9  3.7 - 5.3 mEq/L   Chloride 102  96 - 112 mEq/L   CO2 25  19 - 32 mEq/L   Glucose, Bld 96  70 - 99 mg/dL   BUN 7  6 - 23 mg/dL   Creatinine, Ser 0.93  0.50 - 1.10 mg/dL   Calcium 8.3 (*) 8.4 - 10.5 mg/dL   Total Protein 6.8  6.0 - 8.3 g/dL   Albumin 2.7 (*) 3.5 - 5.2 g/dL   AST 69 (*) 0 - 37 U/L   ALT 305 (*) 0 - 35 U/L   Alkaline Phosphatase 122 (*) 39 - 117 U/L   Total Bilirubin 0.6  0.3 - 1.2 mg/dL   GFR calc non Af Amer 85 (*) >90 mL/min   GFR calc Af Amer >90  >90 mL/min   Comment: (NOTE)     The eGFR has been calculated using the CKD EPI equation.     This calculation has not been validated in all clinical situations.     eGFR's persistently <90 mL/min signify possible Chronic Kidney     Disease.   Anion gap 10  5 - 15  LIPASE, BLOOD     Status: None   Collection Time    11/02/13  5:35 AM      Result Value Ref Range   Lipase 43  11 - 59 U/L  COMPREHENSIVE METABOLIC PANEL     Status: Abnormal   Collection Time    11/03/13  4:37 AM      Result Value Ref Range   Sodium 138  137 - 147 mEq/L   Potassium 4.3  3.7 - 5.3 mEq/L   Chloride 105  96 - 112 mEq/L   CO2 22  19 - 32 mEq/L   Glucose, Bld 130 (*) 70 - 99 mg/dL   BUN 7  6 - 23 mg/dL   Creatinine, Ser 0.75  0.50 - 1.10 mg/dL   Calcium 8.6   8.4 - 10.5 mg/dL   Total Protein 7.0  6.0 - 8.3 g/dL   Albumin 2.8 (*) 3.5 - 5.2 g/dL   AST 126 (*) 0 - 37 U/L   ALT 329 (*) 0 - 35 U/L   Alkaline Phosphatase 127 (*) 39 - 117 U/L   Total Bilirubin 0.3  0.3 - 1.2 mg/dL   GFR calc non Af Amer >90  >90 mL/min   GFR calc Af Amer >90  >90 mL/min   Comment: (NOTE)     The eGFR has been calculated using the CKD EPI equation.     This calculation has not been validated in all clinical situations.     eGFR's persistently <90 mL/min signify possible Chronic Kidney     Disease.   Anion gap 11  5 - 15    Imaging / Studies: Dg Cholangiogram Operative  11/02/2013   CLINICAL DATA:  Pancreatitis.  Gallstones.  EXAM: INTRAOPERATIVE CHOLANGIOGRAM  TECHNIQUE: Cholangiographic images from the C-arm fluoroscopic device were submitted for interpretation post-operatively. Please see the procedural report for the amount of contrast and the fluoroscopy time utilized.  COMPARISON:  Ultrasound 09/23/ 2015.  FINDINGS: Subtle lucency in the distal common bile duct cannot be excluded. This may be related to overlying bowel gas. No evidence of obstruction. Contrast empties into the duodenum. No prominent biliary distention. Findings discussed with patient's physician at time of study.  IMPRESSION: Subtle lucency noted project over distal common bile duct. This may represent overlying bowel gas. Small distal common bile  duct stone cannot be completely excluded.   Electronically Signed   By: [place]   On: 11/02/2013 10:13    Medications / Allergies:  Scheduled Meds: . heparin  5,000 Units Subcutaneous 3 times per day  . pantoprazole (PROTONIX) IV  40 mg Intravenous QHS  . piperacillin-tazobactam (ZOSYN)  IV  3.375 g Intravenous 3 times per day   Continuous Infusions: . dextrose 5 % and 0.9 % NaCl with KCl 20 mEq/L 100 mL/hr at 11/02/13 2238   PRN Meds:.acetaminophen, morphine injection, ondansetron, oxyCODONE  Antibiotics: Anti-infectives   Start      Dose/Rate Route Frequency Ordered Stop   10/31/13 1000  piperacillin-tazobactam (ZOSYN) IVPB 3.375 g     3.375 g 12.5 mL/hr over 240 Minutes Intravenous 3 times per day 10/31/13 0910     10/31/13 0915  piperacillin-tazobactam (ZOSYN) IVPB 3.375 g  Status:  Discontinued     3.375 g 12.5 mL/hr over 240 Minutes Intravenous 3 times per day 10/31/13 2992 10/31/13 0908        Assessment/Plan Obesity  PCOS  Gallstone pancreatitis  Leukocytosis POD#1 laparoscopic cholecystectomy with IOC---Dr. Zella Richer 11/02/13 Questionable CBD stone on IOC versus artifact.  AST/ALT slightly up today, but could be related to surgery, bilirubin is no-rmal.  Will repeat LFTs in AM.  If stable or improved, DC home tomorrow.  If increased, then will obtain a GI consultation or a MRCP. -SLIV -mobilize -pain control -IS -SCD/heparin -OK to stop zosyn?   Erby Pian, [facility] Pager [phone](7A-4:30P)   11/03/2013 8:46 AM

## 2013-11-03 NOTE — Progress Notes (Signed)
Alert, nad, on phone  Soft, incisions ok. Mild ttp at incisions.   Discussed intraoperative findings with pt Repeat LFTs in am. If don't drop significantly - MRCP  Mary Sella. Andrey Campanile, MD, FACS General, Bariatric, & Minimally Invasive Surgery [facility], [place]

## 2013-11-04 ENCOUNTER — Inpatient Hospital Stay (HOSPITAL_COMMUNITY): Payer: No Typology Code available for payment source

## 2013-11-04 LAB — HEPATIC FUNCTION PANEL
ALT: 420 U/L — AB (ref 0–35)
AST: 197 U/L — ABNORMAL HIGH (ref 0–37)
Albumin: 2.7 g/dL — ABNORMAL LOW (ref 3.5–5.2)
Alkaline Phosphatase: 111 U/L (ref 39–117)
Bilirubin, Direct: <0.2 mg/dL (ref 0.0–0.3)
TOTAL PROTEIN: 6.3 g/dL (ref 6.0–8.3)
Total Bilirubin: 0.2 mg/dL — ABNORMAL LOW (ref 0.3–1.2)

## 2013-11-04 NOTE — Care Management Note (Signed)
    Page 1 of 1   11/04/2013     12:44:00 PM CARE MANAGEMENT NOTE 11/04/2013  Patient:  [facility]   Account Number:  [id number]  Date Initiated:  10/30/2013  Documentation initiated by:  Lorenda IshiharaPEELE,SUZANNE  Subjective/Objective Assessment:   23 yo female admitted with pancreatitis. PTA lived at home.     Action/Plan:   Home when stable   Anticipated DC Date:  11/06/2013   Anticipated DC Plan:  HOME/SELF CARE      DC Planning Services  CM consult      Choice offered to / List presented to:             Status of service:  In process, will continue to follow Medicare Important Message given?   (If response is "NO", the following Medicare IM given date fields will be blank) Date Medicare IM given:   Medicare IM given by:   Date Additional Medicare IM given:   Additional Medicare IM given by:    Discharge Disposition:  HOME/SELF CARE  Per UR Regulation:  Reviewed for med. necessity/level of care/duration of stay  If discussed at Long Length of Stay Meetings, dates discussed:   11/04/2013    Comments:  11/04/13 Lousie Calico RN,BSN NCM [phone] POD#2 LAP CHOLE.NPO,ELEVATED LFT'S.GI CONS.NO ANTICIPATED D/C NEEDS.

## 2013-11-04 NOTE — Consult Note (Signed)
Unassigned Consult  Reason for Consult: ? Choledocholithiasis Referring Physician: [facility]  Cordell Zuch HPI: This is a 23 year old female who was admitted one week ago with a gallstone pancreatitis.  She had "GERD" symptoms preceding the admission.  On the day of admission her symptoms markedly worsened.  After her gallstone pancreatitis resolved she underwent a lap chole.  An IOC reports the possibility of a retained CBD stone.  Over the course of the post operative days she is noted to have an increase in he transaminases.  Clinically she is stable.  Sore at the incision sites.  Past Medical History  Diagnosis Date  . PCOS (polycystic ovarian syndrome)   . Oligomenorrhea   . Weight gain 09/09/2008  . Abnormal Pap smear 08/2011    LGSIL/CIN-1  . Obesity     Past Surgical History  Procedure Laterality Date  . Cholecystectomy N/A 11/02/2013    Procedure: LAPAROSCOPIC CHOLECYSTECTOMY WITH INTRAOPERATIVE CHOLANGIOGRAM;  Surgeon: Avel Peaceodd Rosenbower, MD;  Location: [facility];  Service: General;  Laterality: N/A;    Family History  Problem Relation Age of Onset  . Kidney disease Mother   . Heart disease Father     congestive heart failure  . Diabetes Father   . Hypertension Father   . Cancer Paternal Aunt     Social History:  reports that she quit smoking about 2 years ago. Her smoking use included Cigars. She has never used smokeless tobacco. She reports that she drinks alcohol. She reports that she does not use illicit drugs.  Allergies: No Known Allergies  Medications:  Scheduled: . heparin  5,000 Units Subcutaneous 3 times per day  . pantoprazole (PROTONIX) IV  40 mg Intravenous QHS  . sodium chloride  3 mL Intravenous Q12H   Continuous:   Results for orders placed during the hospital encounter of 10/29/13 (from the past 24 hour(s))  HEPATIC FUNCTION PANEL     Status: Abnormal   Collection Time    11/04/13  4:45 AM      Result Value Ref Range   Total Protein 6.3  6.0 - 8.3 g/dL   Albumin 2.7 (*) 3.5 - 5.2 g/dL   AST 161197 (*) 0 - 37 U/L   ALT 420 (*) 0 - 35 U/L   Alkaline Phosphatase 111  39 - 117 U/L   Total Bilirubin 0.2 (*) 0.3 - 1.2 mg/dL   Bilirubin, Direct <0.9<0.2  0.0 - 0.3 mg/dL   Indirect Bilirubin NOT CALCULATED  0.3 - 0.9 mg/dL     No results found.  ROS:  As stated above in the HPI otherwise negative.  Blood pressure 108/59, pulse 67, temperature 98.2 F (36.8 C), temperature source Oral, resp. rate 20, height 5\' 9"  (1.753 m), weight 147.5 kg (325 lb 2.9 oz), last menstrual period 09/10/2013, SpO2 100.00%.    PE: Gen: NAD, Alert and Oriented HEENT:  [place]/AT, EOMI Neck: Supple, no LAD Lungs: CTA Bilaterally CV: RRR without M/G/R ABM: Soft, NTND, +BS Ext: No C/C/E  Assessment/Plan: 1) Choledocholithiasis. 2) Abnormal liver enzymes.   She will undergo an MRCP.  If this is positive she will undergo an ERCP Thursday at 1:30 PM.  The patient is currently scheduled for that first available time slot.  Plan: 1) Await MRCP. 2) Follow liver enzymes.  Chasity Outten D 11/04/2013, 11:58 AM

## 2013-11-04 NOTE — ED Provider Notes (Signed)
Medical screening examination/treatment/procedure(s) were conducted as a shared visit with non-physician practitioner(s) and myself.  I personally evaluated the patient during the encounter.   EKG Interpretation   Date/Time:  Wednesday October 29 2013 15:08:41 EDT Ventricular Rate:  80 PR Interval:  138 QRS Duration: 91 QT Interval:  375 QTC Calculation: 433 R Axis:   27 Text Interpretation:  Sinus rhythm Baseline wander in lead(s) V3 V4 V5 ED  PHYSICIAN INTERPRETATION AVAILABLE IN [facility] Confirmed by TEST,  Record ([id number]) on 10/31/2013 7:21:14 AM       Patient with acute cholecystitis causing pancreatitis. Stable at this time, pain controlled. D/w surgery, will give abx and admit.  Audree CamelScott T Shacarra Choe, MD 11/04/13 [phone]

## 2013-11-04 NOTE — Progress Notes (Signed)
Patient ID: Erica Mayer, female   DOB: [birth year], 23 y.o.   MRN: [id number]     [facility]      [address] [place] [place], [place] [address]    Phone: [phone] FAX: [phone]     Subjective: Ast/alt up today, normal bilirubin.  Denies worsening pain.  Afebrile.  VSS.  Pain well controlled with PO meds, ambulating, voiding and passing flatus.   Objective:  Vital signs:  Filed Vitals:   11/03/13 0526 11/03/13 1348 11/03/13 2253 11/04/13 0634  BP: 112/67 120/52 140/55 108/59  Pulse: 60 62 78 67  Temp: 97.8 F (36.6 C) 98.3 F (36.8 C) 98.3 F (36.8 C) 98.2 F (36.8 C)  TempSrc: Oral Oral Oral Oral  Resp: _0 Height:      Weight:      SpO2: 98% 96% 100% 100%    Last BM Date: 10/28/13  Intake/Output   Yesterday:  09/28 0701 - 09/29 0700 In: 480 [P.O.:480] Out: -  This shift:    I/O last 3 completed shifts: In: 0923 [P.O.:480; I.V.:1300] Out: -     Physical Exam: General: Pt awake/alert/oriented x4 in no acute distress Chest: cta.  No chest wall pain w good excursion CV:  Pulses intact.  Regular rhythm MS: Normal AROM mjr joints.  No obvious deformity Abdomen: Soft.  Nondistended.   Mildly tender at incisions only.  Incisions are c/d/i. No evidence of peritonitis.  No incarcerated hernias. Ext:  SCDs BLE.  No mjr edema.  No cyanosis Skin: No petechiae / purpura   Problem List:   Principal Problem:   Gallstone pancreatitis    Results:   Labs: Results for orders placed during the hospital encounter of 10/29/13 (from the past 48 hour(s))  COMPREHENSIVE METABOLIC PANEL     Status: Abnormal   Collection Time    11/03/13  4:37 AM      Result Value Ref Range   Sodium 138  137 - 147 mEq/L   Potassium 4.3  3.7 - 5.3 mEq/L   Chloride 105  96 - 112 mEq/L   CO2 22  19 - 32 mEq/L   Glucose, Bld 130 (*) 70 - 99 mg/dL   BUN 7  6 - 23 mg/dL   Creatinine, Ser 0.75  0.50 - 1.10 mg/dL   Calcium 8.6  8.4 -  10.5 mg/dL   Total Protein 7.0  6.0 - 8.3 g/dL   Albumin 2.8 (*) 3.5 - 5.2 g/dL   AST 126 (*) 0 - 37 U/L   ALT 329 (*) 0 - 35 U/L   Alkaline Phosphatase 127 (*) 39 - 117 U/L   Total Bilirubin 0.3  0.3 - 1.2 mg/dL   GFR calc non Af Amer >90  >90 mL/min   GFR calc Af Amer >90  >90 mL/min   Comment: (NOTE)     The eGFR has been calculated using the CKD EPI equation.     This calculation has not been validated in all clinical situations.     eGFR's persistently <90 mL/min signify possible Chronic Kidney     Disease.   Anion gap 11  5 - 15  HEPATIC FUNCTION PANEL     Status: Abnormal   Collection Time    11/04/13  4:45 AM      Result Value Ref Range   Total Protein 6.3  6.0 - 8.3 g/dL   Albumin 2.7 (*) 3.5 - 5.2 g/dL  AST 197 (*) 0 - 37 U/L   ALT 420 (*) 0 - 35 U/L   Alkaline Phosphatase 111  39 - 117 U/L   Total Bilirubin 0.2 (*) 0.3 - 1.2 mg/dL   Bilirubin, Direct <0.2  0.0 - 0.3 mg/dL   Indirect Bilirubin NOT CALCULATED  0.3 - 0.9 mg/dL    Imaging / Studies: Dg Cholangiogram Operative  11/02/2013   CLINICAL DATA:  Pancreatitis.  Gallstones.  EXAM: INTRAOPERATIVE CHOLANGIOGRAM  TECHNIQUE: Cholangiographic images from the C-arm fluoroscopic device were submitted for interpretation post-operatively. Please see the procedural report for the amount of contrast and the fluoroscopy time utilized.  COMPARISON:  Ultrasound 09/23/ 2015.  FINDINGS: Subtle lucency in the distal common bile duct cannot be excluded. This may be related to overlying bowel gas. No evidence of obstruction. Contrast empties into the duodenum. No prominent biliary distention. Findings discussed with patient's physician at time of study.  IMPRESSION: Subtle lucency noted project over distal common bile duct. This may represent overlying bowel gas. Small distal common bile duct stone cannot be completely excluded.   Electronically Signed   By: [place]   On: 11/02/2013 10:13    Medications / Allergies:   Scheduled Meds: . heparin  5,000 Units Subcutaneous 3 times per day  . pantoprazole (PROTONIX) IV  40 mg Intravenous QHS  . sodium chloride  3 mL Intravenous Q12H   Continuous Infusions:  PRN Meds:.acetaminophen, morphine injection, ondansetron, oxyCODONE, phenol, sodium chloride  Antibiotics: Anti-infectives   Start     Dose/Rate Route Frequency Ordered Stop   10/31/13 1000  piperacillin-tazobactam (ZOSYN) IVPB 3.375 g  Status:  Discontinued     3.375 g 12.5 mL/hr over 240 Minutes Intravenous 3 times per day 10/31/13 0910 11/03/13 1609   10/31/13 0915  piperacillin-tazobactam (ZOSYN) IVPB 3.375 g  Status:  Discontinued     3.375 g 12.5 mL/hr over 240 Minutes Intravenous 3 times per day 10/31/13 2230 10/31/13 0908        Assessment/Plan POD#2 laparoscopic cholecystectomy with IOC Abnormal LFTs -consult to GI---IOC could not rule a CBD stone, LFTs up today.  -NPO until GI evaluation -pain control -mobilize -IS/heparin   Erby Pian, ANP-BC [facility] Pager [phone](7A-4:30P)  11/04/2013 8:18 AM

## 2013-11-04 NOTE — Progress Notes (Signed)
Seen and agree with NP note EUS/ERCP not able to be done til later in week so will proceed with MRCP to evaluate duct  Daily lfts.  Mary SellaEric M. Andrey CampanileWilson, MD, FACS General, Bariatric, & Minimally Invasive Surgery [facility], [place]

## 2013-11-05 LAB — HEPATIC FUNCTION PANEL
ALK PHOS: 122 U/L — AB (ref 39–117)
ALT: 462 U/L — AB (ref 0–35)
AST: 194 U/L — AB (ref 0–37)
Albumin: 2.6 g/dL — ABNORMAL LOW (ref 3.5–5.2)
Total Bilirubin: 0.3 mg/dL (ref 0.3–1.2)
Total Protein: 6.5 g/dL (ref 6.0–8.3)

## 2013-11-05 NOTE — Progress Notes (Signed)
Report from [place] House[place]ennis, [place] and care assumed for pt at this time. Pt resting in bed, no c/o. Denies pain/nausea. Aware NPO after MN for ERCP in AM. Assessment as charted.

## 2013-11-05 NOTE — Progress Notes (Signed)
Alert nad,  Soft, nt, nd  LFT remain elevated - very suspicious for CBD stone  NPO p MN EUS/ERCP Thursday with Dr Rhona LeavensHung  Kealy Lewter M. Andrey CampanileWilson, MD, FACS General, Bariatric, & Minimally Invasive Surgery [facility], [place]

## 2013-11-05 NOTE — Progress Notes (Signed)
Subjective: No acute events.  Objective: Vital signs in last 24 hours: Temp:  [98.3 F (36.8 C)-98.5 F (36.9 C)] 98.3 F (36.8 C) (09/30 0513) Pulse Rate:  [69-82] 69 (09/30 0513) Resp:  [18] 18 (09/30 0513) BP: (125)/(52-64) 125/59 mmHg (09/30 0513) SpO2:  [98 %-100 %] 100 % (09/30 0513) Last BM Date: 11/04/13  Intake/Output from previous day:   Intake/Output this shift:    General appearance: alert and no distress GI: tende at the incision sites  Lab Results: No results found for this basename: WBC, HGB, HCT, PLT,  in the last 72 hours BMET  Recent Labs  11/03/13 0437  NA 138  K 4.3  CL 105  CO2 22  GLUCOSE 130*  BUN 7  CREATININE 0.75  CALCIUM 8.6   LFT  Recent Labs  11/05/13 0515  PROT 6.5  ALBUMIN 2.6*  AST 194*  ALT 462*  ALKPHOS 122*  BILITOT 0.3  BILIDIR <0.2  IBILI NOT CALCULATED   PT/INR No results found for this basename: LABPROT, INR,  in the last 72 hours Hepatitis Panel No results found for this basename: HEPBSAG, HCVAB, HEPAIGM, HEPBIGM,  in the last 72 hours C-Diff No results found for this basename: CDIFFTOX,  in the last 72 hours Fecal Lactopherrin No results found for this basename: FECLLACTOFRN,  in the last 72 hours  Studies/Results: No results found.  Medications:  Scheduled: . heparin  5,000 Units Subcutaneous 3 times per day  . pantoprazole (PROTONIX) IV  40 mg Intravenous QHS  . sodium chloride  3 mL Intravenous Q12H   Continuous:   Assessment/Plan: 1) Abnormal liver enzymes. 2) ? Choledocholithiasis.   I discussed the procedure in detail and specifically discussed the issue of post-ERCP pancreatitis.  I will try to minimize that complication by giving her 100 mg of Indomethacin PR post ERCP.  Plan: 1) ERCP tomorrow.   LOS: 7 days   Mahdi Frye D 11/05/2013, 12:33 PM  

## 2013-11-05 NOTE — Progress Notes (Signed)
Patient ID: Erica RidgesMegan Montour, female   DOB: 06/17/1990, 23 y.o.   MRN: [id number]     [facility]      [address]   [place], [place] [id number]    Phone: [phone] FAX: [phone]     Subjective: No increase in pain.  VSS.  Afebrile.  Had a BM last night.   Objective:  Vital signs:  Filed Vitals:   11/04/13 0634 11/04/13 1357 11/04/13 2215 11/05/13 0513  BP: 108/59 125/64 125/52 125/59  Pulse: 67 73 82 69  Temp: 98.2 F (36.8 C) 98.5 F (36.9 C) 98.4 F (36.9 C) 98.3 F (36.8 C)  TempSrc: Oral Oral Oral Oral  Resp: 20 18 18 18   Height:      Weight:      SpO2: 100% 98% 98% 100%    Last BM Date: 10/28/13   Physical Exam:  General: Pt awake/alert/oriented x4 in no acute distress  Chest: cta. No chest wall pain w good excursion  CV: Pulses intact. Regular rhythm  MS: Normal AROM mjr joints. No obvious deformity  Abdomen: Soft. Nondistended. Mildly tender at incisions only. Incisions are c/d/i, removed dressing, steri strips in place. No evidence of peritonitis. No incarcerated hernias.  Ext: SCDs BLE. No mjr edema. No cyanosis  Skin: No petechiae / purpura   Problem List:   Principal Problem:   Gallstone pancreatitis    Results:   Labs: Results for orders placed during the hospital encounter of 10/29/13 (from the past 48 hour(s))  HEPATIC FUNCTION PANEL     Status: Abnormal   Collection Time    11/04/13  4:45 AM      Result Value Ref Range   Total Protein 6.3  6.0 - 8.3 g/dL   Albumin 2.7 (*) 3.5 - 5.2 g/dL   AST 401197 (*) 0 - 37 U/L   ALT 420 (*) 0 - 35 U/L   Alkaline Phosphatase 111  39 - 117 U/L   Total Bilirubin 0.2 (*) 0.3 - 1.2 mg/dL   Bilirubin, Direct <0.2<0.2  0.0 - 0.3 mg/dL   Indirect Bilirubin NOT CALCULATED  0.3 - 0.9 mg/dL  HEPATIC FUNCTION PANEL     Status: Abnormal   Collection Time    11/05/13  5:15 AM      Result Value Ref Range   Total Protein 6.5  6.0 - 8.3 g/dL   Albumin 2.6 (*) 3.5 - 5.2  g/dL   AST 725194 (*) 0 - 37 U/L   ALT 462 (*) 0 - 35 U/L   Alkaline Phosphatase 122 (*) 39 - 117 U/L   Total Bilirubin 0.3  0.3 - 1.2 mg/dL   Bilirubin, Direct <3.6<0.2  0.0 - 0.3 mg/dL   Indirect Bilirubin NOT CALCULATED  0.3 - 0.9 mg/dL    Imaging / Studies: No results found.  Medications / Allergies:  Scheduled Meds: . heparin  5,000 Units Subcutaneous 3 times per day  . pantoprazole (PROTONIX) IV  40 mg Intravenous QHS  . sodium chloride  3 mL Intravenous Q12H   Continuous Infusions:  PRN Meds:.acetaminophen, morphine injection, ondansetron, oxyCODONE, phenol, sodium chloride  Antibiotics: Anti-infectives   Start     Dose/Rate Route Frequency Ordered Stop   10/31/13 1000  piperacillin-tazobactam (ZOSYN) IVPB 3.375 g  Status:  Discontinued     3.375 g 12.5 mL/hr over 240 Minutes Intravenous 3 times per day 10/31/13 0910 11/03/13 1609   10/31/13 0915  piperacillin-tazobactam (ZOSYN) IVPB 3.375 g  Status:  Discontinued     3.375 g 12.5 mL/hr over 240 Minutes Intravenous 3 times per day 10/31/13 1610 10/31/13 0908        Assessment/Plan Obesity  PCOS  Gallstone pancreatitis  Leukocytosis  POD#3 laparoscopic cholecystectomy with IOC---Dr. Abbey Chatters 11/02/13 Abnormal LFTs -appreciate GI assistance.  Unfortunately, the MRCP was unable to be completed due to body habitus.   -EUS/ERCP on Thursday at 1:30PM -pain control  -mobilize  -IS/heparin   Ashok Norris, [facility] Pager [phone](7A-4:30P)   11/05/2013 8:37 AM

## 2013-11-06 ENCOUNTER — Inpatient Hospital Stay (HOSPITAL_COMMUNITY): Payer: No Typology Code available for payment source

## 2013-11-06 ENCOUNTER — Encounter (HOSPITAL_COMMUNITY): Admission: EM | Disposition: A | Discharge status: Home / Self Care | Payer: Self-pay | Source: Home / Self Care

## 2013-11-06 ENCOUNTER — Encounter (HOSPITAL_COMMUNITY): Payer: No Typology Code available for payment source | Admitting: Anesthesiology

## 2013-11-06 ENCOUNTER — Inpatient Hospital Stay (HOSPITAL_COMMUNITY): Payer: No Typology Code available for payment source | Admitting: Anesthesiology

## 2013-11-06 ENCOUNTER — Encounter (HOSPITAL_COMMUNITY): Payer: Self-pay | Admitting: *Deleted

## 2013-11-06 HISTORY — PX: ERCP: SHX5425

## 2013-11-06 LAB — HEPATIC FUNCTION PANEL
ALT: 374 U/L — ABNORMAL HIGH (ref 0–35)
AST: 112 U/L — ABNORMAL HIGH (ref 0–37)
Albumin: 2.6 g/dL — ABNORMAL LOW (ref 3.5–5.2)
Alkaline Phosphatase: 111 U/L (ref 39–117)
Total Protein: 6.2 g/dL (ref 6.0–8.3)

## 2013-11-06 SURGERY — ERCP, WITH INTERVENTION IF INDICATED
Anesthesia: Monitor Anesthesia Care

## 2013-11-06 SURGERY — ERCP, WITH INTERVENTION IF INDICATED
Anesthesia: General

## 2013-11-06 MED ORDER — MIDAZOLAM HCL 5 MG/5ML IJ SOLN
INTRAMUSCULAR | Status: DC | PRN
  Administered 2013-11-06: 2 mg via INTRAVENOUS

## 2013-11-06 MED ORDER — LIDOCAINE HCL (CARDIAC) 20 MG/ML IV SOLN
INTRAVENOUS | Status: AC
  Filled 2013-11-06: qty 5

## 2013-11-06 MED ORDER — ONDANSETRON HCL 4 MG/2ML IJ SOLN
INTRAMUSCULAR | Status: DC | PRN
  Administered 2013-11-06: 4 mg via INTRAVENOUS

## 2013-11-06 MED ORDER — ONDANSETRON HCL 4 MG/2ML IJ SOLN
INTRAMUSCULAR | Status: AC
  Filled 2013-11-06: qty 2

## 2013-11-06 MED ORDER — FENTANYL CITRATE 0.05 MG/ML IJ SOLN
INTRAMUSCULAR | Status: DC | PRN
  Administered 2013-11-06: 100 ug via INTRAVENOUS

## 2013-11-06 MED ORDER — NEOSTIGMINE METHYLSULFATE 10 MG/10ML IV SOLN
INTRAVENOUS | Status: DC | PRN
  Administered 2013-11-06: 4 mg via INTRAVENOUS

## 2013-11-06 MED ORDER — FENTANYL CITRATE 0.05 MG/ML IJ SOLN
INTRAMUSCULAR | Status: AC
Start: 2013-11-06 — End: 2013-11-06
  Filled 2013-11-06: qty 2

## 2013-11-06 MED ORDER — IOHEXOL 350 MG/ML SOLN
INTRAVENOUS | Status: DC | PRN
  Administered 2013-11-06: 15:00:00

## 2013-11-06 MED ORDER — SUCCINYLCHOLINE CHLORIDE 20 MG/ML IJ SOLN
INTRAMUSCULAR | Status: DC | PRN
  Administered 2013-11-06: 140 mg via INTRAVENOUS

## 2013-11-06 MED ORDER — INDOMETHACIN 50 MG RE SUPP
100.0000 mg | Freq: Once | RECTAL | Status: AC
  Administered 2013-11-06: 100 mg via RECTAL
  Filled 2013-11-06 (×2): qty 2

## 2013-11-06 MED ORDER — LIDOCAINE HCL (CARDIAC) 20 MG/ML IV SOLN
INTRAVENOUS | Status: DC | PRN
  Administered 2013-11-06: 50 mg via INTRAVENOUS

## 2013-11-06 MED ORDER — MIDAZOLAM HCL 2 MG/2ML IJ SOLN
INTRAMUSCULAR | Status: AC
Start: 2013-11-06 — End: 2013-11-06
  Filled 2013-11-06: qty 2

## 2013-11-06 MED ORDER — PROPOFOL 10 MG/ML IV BOLUS
INTRAVENOUS | Status: DC | PRN
  Administered 2013-11-06: 200 mg via INTRAVENOUS

## 2013-11-06 MED ORDER — PROPOFOL 10 MG/ML IV BOLUS
INTRAVENOUS | Status: AC
  Filled 2013-11-06: qty 20

## 2013-11-06 MED ORDER — SODIUM CHLORIDE 0.9 % IV SOLN: INTRAVENOUS | Status: DC

## 2013-11-06 MED ORDER — GLUCAGON HCL RDNA (DIAGNOSTIC) 1 MG IJ SOLR
INTRAMUSCULAR | Status: AC
  Filled 2013-11-06: qty 2

## 2013-11-06 MED ORDER — GLYCOPYRROLATE 0.2 MG/ML IJ SOLN
INTRAMUSCULAR | Status: DC | PRN
  Administered 2013-11-06: 0.6 mg via INTRAVENOUS

## 2013-11-06 MED ORDER — MEPERIDINE HCL 25 MG/ML IJ SOLN: 6.2500 mg | INTRAMUSCULAR | Status: DC | PRN

## 2013-11-06 MED ORDER — PROMETHAZINE HCL 25 MG/ML IJ SOLN: 6.2500 mg | INTRAMUSCULAR | Status: DC | PRN

## 2013-11-06 MED ORDER — LACTATED RINGERS IV SOLN
INTRAVENOUS | Status: DC
  Administered 2013-11-06: 1000 mL via INTRAVENOUS

## 2013-11-06 MED ORDER — LACTATED RINGERS IV SOLN
INTRAVENOUS | Status: DC
  Administered 2013-11-06: 17:00:00 via INTRAVENOUS

## 2013-11-06 NOTE — Transfer of Care (Signed)
Immediate Anesthesia Transfer of Care Note  Patient: Erica Mayer  Procedure(s) Performed: Procedure(s): ENDOSCOPIC RETROGRADE CHOLANGIOPANCREATOGRAPHY (ERCP) (N/A)  Patient Location: PACU  Anesthesia Type:General  Level of Consciousness: awake, alert  and oriented  Airway & Oxygen Therapy: Patient Spontanous Breathing and Patient connected to face mask oxygen  Post-op Assessment: Report given to PACU RN and Post -op Vital signs reviewed and stable  Post vital signs: Reviewed and stable  Complications: No apparent anesthesia complications

## 2013-11-06 NOTE — Progress Notes (Signed)
Patient ID: Erica Mayer, female   DOB: 04/25/1990, 23 y.o.   MRN: [id number]     [facility]      [address]   [place], [place] [id number]    Phone: [phone] FAX: [phone]     Subjective: No issues overnight.  VSS.  Afebrile.   Objective:  Vital signs:  Filed Vitals:   11/05/13 0513 11/05/13 1404 11/05/13 2105 11/06/13 0518  BP: 125/59 117/61 136/79 111/51  Pulse: 69 61 75 70  Temp: 98.3 F (36.8 C) 98.1 F (36.7 C) 98.4 F (36.9 C) 98.1 F (36.7 C)  TempSrc: Oral Oral Oral Oral  Resp: 18 18 18 18   Height:      Weight:      SpO2: 100% 100% 100% 100%    Last BM Date: 11/04/13  Intake/Output   Yesterday:  09/30 0701 - 10/01 0700 In: 480 [P.O.:480] Out: -  This shift:    I/O last 3 completed shifts: In: 480 [P.O.:480] Out: -     Physical Exam:  General: Pt awake/alert/oriented x4 in no acute distress  Chest: cta. No chest wall pain w good excursion  CV: Pulses intact. Regular rhythm  MS: Normal AROM mjr joints. No obvious deformity  Abdomen: Soft. Nondistended. Mildly tender at incisions only. Incisions are c/d/i, removed dressing, steri strips in place. No evidence of peritonitis. No incarcerated hernias.  Ext: SCDs BLE. No mjr edema. No cyanosis  Skin: No petechiae / purpura   Problem List:   Principal Problem:   Gallstone pancreatitis    Results:   Labs: Results for orders placed during the hospital encounter of 10/29/13 (from the past 48 hour(s))  HEPATIC FUNCTION PANEL     Status: Abnormal   Collection Time    11/05/13  5:15 AM      Result Value Ref Range   Total Protein 6.5  6.0 - 8.3 g/dL   Albumin 2.6 (*) 3.5 - 5.2 g/dL   AST 644194 (*) 0 - 37 U/L   ALT 462 (*) 0 - 35 U/L   Alkaline Phosphatase 122 (*) 39 - 117 U/L   Total Bilirubin 0.3  0.3 - 1.2 mg/dL   Bilirubin, Direct <0.3<0.2  0.0 - 0.3 mg/dL   Indirect Bilirubin NOT CALCULATED  0.3 - 0.9 mg/dL  HEPATIC FUNCTION PANEL     Status:  Abnormal   Collection Time    11/06/13  4:25 AM      Result Value Ref Range   Total Protein 6.2  6.0 - 8.3 g/dL   Albumin 2.6 (*) 3.5 - 5.2 g/dL   AST 474112 (*) 0 - 37 U/L   ALT 374 (*) 0 - 35 U/L   Alkaline Phosphatase 111  39 - 117 U/L   Total Bilirubin <0.2 (*) 0.3 - 1.2 mg/dL   Bilirubin, Direct <2.5<0.2  0.0 - 0.3 mg/dL   Indirect Bilirubin NOT CALCULATED  0.3 - 0.9 mg/dL    Imaging / Studies: No results found.  Medications / Allergies:  Scheduled Meds: . pantoprazole (PROTONIX) IV  40 mg Intravenous QHS  . sodium chloride  3 mL Intravenous Q12H   Continuous Infusions:  PRN Meds:.acetaminophen, morphine injection, ondansetron, oxyCODONE, phenol, sodium chloride  Antibiotics: Anti-infectives   Start     Dose/Rate Route Frequency Ordered Stop   10/31/13 1000  piperacillin-tazobactam (ZOSYN) IVPB 3.375 g  Status:  Discontinued     3.375 g 12.5 mL/hr over 240 Minutes Intravenous 3 times per day  10/31/13 0910 11/03/13 1609   10/31/13 0915  piperacillin-tazobactam (ZOSYN) IVPB 3.375 g  Status:  Discontinued     3.375 g 12.5 mL/hr over 240 Minutes Intravenous 3 times per day 10/31/13 0904 10/31/13 0908        Assessment/Plan Obesity  PCOS  Gallstone pancreatitis  Leukocytosis  POD#4 laparoscopic cholecystectomy with IOC---Dr. Abbey Chatters 11/02/13  Abnormal LFTs  -appreciate GI assistance. Unfortunately, the MRCP was unable to be completed due to body habitus.  -ERCP today at 1:30PM  -pain control  -mobilize  -IS -SCD/heparin on hold for procedure  Dipso: discharge once cleared by GI  Ashok Norris, ANP-BC [facility] Pager [phone](7A-4:30P)   11/06/2013 8:32 AM

## 2013-11-06 NOTE — Op Note (Signed)
[facility] [address] [place], [id number]   ERCP PROCEDURE REPORT  PATIENT: Erica RidgesWiley, Zarea  MR# :[id number] BIRTHDATE: 1990/02/25  GENDER: female ENDOSCOPIST: Jeani HawkingPatrick Kienna Moncada, MD REFERRED BY: PROCEDURE DATE:  11/06/2013 PROCEDURE:   ERCP with removal of calculus/calculi ASA CLASS:    Class III INDICATIONS: ? Choledocholithiasis MEDICATIONS:    General Anesthesia, Indomethacin 100 mg PR post ERCP TOPICAL ANESTHETIC:  DESCRIPTION OF PROCEDURE:   After the risks benefits and alternatives of the procedure were thoroughly explained, informed consent was obtained.  The     endoscope was introduced through the mouth and advanced to the second portion of the duodenum.  The papilla was identified and there was spontaneous drainage of bile. The papilla was cannulated and during the first two attempts the guidewire briefly cannulated the PD.  Repositioning of the sphincterotome allowed for cannulation of the CBD.  The guidewire was secured in the right intrahepatic ducts.  Contrast injection revealed a dilated CBD to approximately 1 cm and a distal oval lucency in the distal CBD.  No evidence of any cystic duct leak. The cystic duct was noted to be long and without any filling defects.  A 1-1.5 cm sphincterotomy was created and excellent drainage was achieved.  The CBD was swept three times, but no stones were extracted.  A final occlusion cholangiogram performed during the fourth pass was negative for any evidence of retained stones.  At this point the procedure was terminated.       COMPLICATIONS:   No immedicate complications.  ENDOSCOPIC IMPRESSION: 1) Dilated CBD.  No clear evidence of a CBD stone.  RECOMMENDATIONS: 1) Routine postoperative care per Surgery.     _______________________________ eSignedJeani Hawking:  Anett Ranker, MD 11/06/2013 2:37 PM   CC:

## 2013-11-06 NOTE — Progress Notes (Signed)
ERCP/sphincterotomy procedure note reviewed -no clear CBD stone. Appreciate GI assistance  If no issues overnight, plan d/c in am Repeat LFTs as outpt  Mary SellaEric M. Andrey CampanileWilson, MD, FACS General, Bariatric, & Minimally Invasive Surgery [facility] [place]

## 2013-11-06 NOTE — H&P (View-Only) (Signed)
Subjective: No acute events.  Objective: Vital signs in last 24 hours: Temp:  [98.3 F (36.8 C)-98.5 F (36.9 C)] 98.3 F (36.8 C) (09/30 0513) Pulse Rate:  [69-82] 69 (09/30 0513) Resp:  [18] 18 (09/30 0513) BP: (125)/(52-64) 125/59 mmHg (09/30 0513) SpO2:  [98 %-100 %] 100 % (09/30 0513) Last BM Date: 11/04/13  Intake/Output from previous day:   Intake/Output this shift:    General appearance: alert and no distress GI: tende at the incision sites  Lab Results: No results found for this basename: WBC, HGB, HCT, PLT,  in the last 72 hours BMET  Recent Labs  11/03/13 0437  NA 138  K 4.3  CL 105  CO2 22  GLUCOSE 130*  BUN 7  CREATININE 0.75  CALCIUM 8.6   LFT  Recent Labs  11/05/13 0515  PROT 6.5  ALBUMIN 2.6*  AST 194*  ALT 462*  ALKPHOS 122*  BILITOT 0.3  BILIDIR <0.2  IBILI NOT CALCULATED   PT/INR No results found for this basename: LABPROT, INR,  in the last 72 hours Hepatitis Panel No results found for this basename: HEPBSAG, HCVAB, HEPAIGM, HEPBIGM,  in the last 72 hours C-Diff No results found for this basename: CDIFFTOX,  in the last 72 hours Fecal Lactopherrin No results found for this basename: FECLLACTOFRN,  in the last 72 hours  Studies/Results: No results found.  Medications:  Scheduled: . heparin  5,000 Units Subcutaneous 3 times per day  . pantoprazole (PROTONIX) IV  40 mg Intravenous QHS  . sodium chloride  3 mL Intravenous Q12H   Continuous:   Assessment/Plan: 1) Abnormal liver enzymes. 2) ? Choledocholithiasis.   I discussed the procedure in detail and specifically discussed the issue of post-ERCP pancreatitis.  I will try to minimize that complication by giving her 100 mg of Indomethacin PR post ERCP.  Plan: 1) ERCP tomorrow.   LOS: 7 days   Erica Mayer D 11/05/2013, 12:33 PM

## 2013-11-06 NOTE — Anesthesia Postprocedure Evaluation (Signed)
  Anesthesia Post-op Note  Patient: Erica Mayer  Procedure(s) Performed: Procedure(s) (LRB): ENDOSCOPIC RETROGRADE CHOLANGIOPANCREATOGRAPHY (ERCP) (N/A)  Patient Location: PACU  Anesthesia Type: General  Level of Consciousness: awake and alert   Airway and Oxygen Therapy: Patient Spontanous Breathing  Post-op Pain: mild  Post-op Assessment: Post-op Vital signs reviewed, Patient's Cardiovascular Status Stable, Respiratory Function Stable, Patent Airway and No signs of Nausea or vomiting  Last Vitals:  Filed Vitals:   11/06/13 1610  BP: 123/60  Pulse: 80  Temp: 36.7 C  Resp: 20    Post-op Vital Signs: stable   Complications: No apparent anesthesia complications

## 2013-11-06 NOTE — Interval H&P Note (Signed)
History and Physical Interval Note:  11/06/2013 1:30 PM  Erica Mayer  has presented today for surgery, with the diagnosis of stone  The various methods of treatment have been discussed with the patient and family. After consideration of risks, benefits and other options for treatment, the patient has consented to  Procedure(s): ENDOSCOPIC RETROGRADE CHOLANGIOPANCREATOGRAPHY (ERCP) (N/A) as a surgical intervention .  The patient's history has been reviewed, patient examined, no change in status, stable for surgery.  I have reviewed the patient's chart and labs.  Questions were answered to the patient's satisfaction.     Casmere Hollenbeck D

## 2013-11-06 NOTE — Anesthesia Preprocedure Evaluation (Signed)
Anesthesia Evaluation  Patient identified by MRN, date of birth, ID band Patient awake    Reviewed: Allergy & Precautions, H&P , NPO status , Patient's Chart, lab work & pertinent test results  Airway Mallampati: II TM Distance: >3 FB Neck ROM: full    Dental no notable dental hx. (+) Teeth Intact, Dental Advisory Given   Pulmonary neg pulmonary ROS, former smoker,  breath sounds clear to auscultation  Pulmonary exam normal       Cardiovascular Exercise Tolerance: Good negative cardio ROS  Rhythm:regular Rate:Normal     Neuro/Psych negative neurological ROS  negative psych ROS   GI/Hepatic negative GI ROS, Neg liver ROS,   Endo/Other  negative endocrine ROSMorbid obesity  Renal/GU negative Renal ROS  negative genitourinary   Musculoskeletal   Abdominal (+) + obese,   Peds  Hematology negative hematology ROS (+)   Anesthesia Other Findings   Reproductive/Obstetrics negative OB ROS                           Anesthesia Physical  Anesthesia Plan  ASA: III  Anesthesia Plan: General   Post-op Pain Management:    Induction: Intravenous  Airway Management Planned: Oral ETT  Additional Equipment:   Intra-op Plan:   Post-operative Plan: Extubation in OR  Informed Consent: I have reviewed the patients History and Physical, chart, labs and discussed the procedure including the risks, benefits and alternatives for the proposed anesthesia with the patient or authorized representative who has indicated his/her understanding and acceptance.   Dental Advisory Given  Plan Discussed with: CRNA and Surgeon  Anesthesia Plan Comments:         Anesthesia Quick Evaluation

## 2013-11-07 ENCOUNTER — Encounter (HOSPITAL_COMMUNITY): Payer: Self-pay | Admitting: Gastroenterology

## 2013-11-07 LAB — COMPREHENSIVE METABOLIC PANEL
ALBUMIN: 2.6 g/dL — AB (ref 3.5–5.2)
ALT: 286 U/L — ABNORMAL HIGH (ref 0–35)
AST: 71 U/L — ABNORMAL HIGH (ref 0–37)
Alkaline Phosphatase: 100 U/L (ref 39–117)
Anion gap: 10 (ref 5–15)
BILIRUBIN TOTAL: 0.3 mg/dL (ref 0.3–1.2)
BUN: 9 mg/dL (ref 6–23)
CALCIUM: 8.6 mg/dL (ref 8.4–10.5)
CHLORIDE: 103 meq/L (ref 96–112)
CO2: 25 mEq/L (ref 19–32)
Creatinine, Ser: 0.88 mg/dL (ref 0.50–1.10)
GFR calc Af Amer: >90 mL/min (ref >90)
GFR calc non Af Amer: >90 mL/min (ref >90)
Glucose, Bld: 99 mg/dL (ref 70–99)
Potassium: 3.9 mEq/L (ref 3.7–5.3)
Sodium: 138 mEq/L (ref 137–147)
Total Protein: 6.3 g/dL (ref 6.0–8.3)

## 2013-11-07 MED ORDER — ACETAMINOPHEN 325 MG PO TABS: 325.0000 mg | ORAL_TABLET | Freq: Four times a day (QID) | ORAL | Status: DC | PRN

## 2013-11-07 MED ORDER — OXYCODONE HCL 5 MG PO TABS: 5.0000 mg | ORAL_TABLET | ORAL | Status: DC | PRN

## 2013-11-07 NOTE — Discharge Summary (Signed)
Physician Discharge Summary  Erica Mayer ZOX:[id number] DOB: [birth year] DOA: 10/29/2013  PCP: Pcp Not In System  Consultation: GI-Dr. Elnoria Howard  Admit date: 10/29/2013 Discharge date: 11/07/2013  Recommendations for Outpatient Follow-up:   Follow-up Information   Schedule an appointment as soon as possible for a visit with Adolph Pollack, MD.   Specialty:  General Surgery   Contact information:   [address] [place] [address] [phone]      Discharge Diagnoses:  1. Gallstone pancreatitis 2. Leukocytosis 3. Abnormal LFTs 4. Obesity 5. PCOS   Surgical Procedure: laparoscopic cholecystectomy with IOC---Dr. Abbey Chatters 11/02/13    Discharge Condition: stable Disposition: home  Diet recommendation: regular  Filed Weights   10/30/13 0112 11/01/13 0834  Weight: 322 lb (146.058 kg) 325 lb 2.9 oz (147.5 kg)    Filed Vitals:   11/07/13 0426  BP: 107/57  Pulse: 69  Temp: 98.4 F (36.9 C)  Resp: 18      Hospital Course:  Erica Mayer is a 23 year old female who presented to [facility] with abdominal pain.  She was found to have significantly elevated LFTs and lipase.  Her US showed cholelithiasis.  She was admitted for IV hydration, pain control and bowel rest.  She developed fevers of 102 and was started on zosyn.   After resolution of pancreatitis, she underwent a laparoscopic cholecystectomy. She tolerated the procedure well and was transferred to the floor.   Her LTFs improved initially and then increased post operatively.  We attempted to do a MRCP, however, due to body habitus this was unable to be completed.  Dr. Elnoria Howard was consulted with GI and the patient underwent an ERCP which was negative.  On POD#5 the patient had stable vital signs, her zosyn was stopped post operatively and she remained afebrile, pain well controlled. LFTs were trending down.  She was therefore felt stable for discharge.  We will have her repeat LFTs in one week and follow up with Dr.  Abbey Chatters.  I have arranged her follow up.  Medication risks, benefits and therapeutic alternatives were reviewed with the patient.  She verbalizes understanding. She was encouraged to call with questions or concerns.     Discharge Instructions     Medication List         acetaminophen 325 MG tablet  Commonly known as:  TYLENOL  Take 1-2 tablets (325-650 mg total) by mouth every 6 (six) hours as needed for fever, headache, mild pain or moderate pain.     oxyCODONE 5 MG immediate release tablet  Commonly known as:  Oxy IR/ROXICODONE  Take 1-2 tablets (5-10 mg total) by mouth every 4 (four) hours as needed for moderate pain (not relieved by tylenol).           Follow-up Information   Schedule an appointment as soon as possible for a visit with Adolph Pollack, MD.   Specialty:  General Surgery   Contact information:   [address] [place] [address] [phone]        The results of significant diagnostics from this hospitalization (including imaging, microbiology, ancillary and laboratory) are listed below for reference.    Significant Diagnostic Studies: Dg Cholangiogram Operative  11/02/2013   CLINICAL DATA:  Pancreatitis.  Gallstones.  EXAM: INTRAOPERATIVE CHOLANGIOGRAM  TECHNIQUE: Cholangiographic images from the C-arm fluoroscopic device were submitted for interpretation post-operatively. Please see the procedural report for the amount of contrast and the fluoroscopy time utilized.  COMPARISON:  Ultrasound 09/23/ 2015.  FINDINGS: Subtle lucency in the distal common bile duct cannot be excluded. This may be related to overlying bowel gas. No evidence of obstruction. Contrast empties into the duodenum. No prominent biliary distention. Findings discussed with patient's physician at time of study.  IMPRESSION: Subtle lucency noted project over distal common bile duct. This may represent overlying bowel gas. Small distal common bile duct stone cannot be  completely excluded.   Electronically Signed   By: Maisie Fushomas  Register   On: 11/02/2013 10:13   [place] Abdomen Complete  10/29/2013   CLINICAL DATA:  Abdominal pain.  EXAM: ULTRASOUND ABDOMEN COMPLETE  COMPARISON:  None.  FINDINGS: Gallbladder:  Numerous gallstones are noted in the gallbladder with associated acoustic shadowing. The gallbladder wall is mildly thickened. No pericholecystic fluid.  Common bile duct:  Diameter: 6.9 mm  Liver:  Normal echogenicity without focal lesions. No intrahepatic biliary dilatation. A small amount of perihepatic fluid is noted.  IVC:  Normal caliber.  Pancreas:  Limited visualization due to poor sonographic window.  Spleen:  Normal size and echogenicity without focal lesions.  Right Kidney:  Length: 11.2 cm. Normal renal cortical thickness and echogenicity without focal lesions or hydronephrosis.  Left Kidney:  Length: 12.3 cm. Normal renal cortical thickness and echogenicity without focal lesions or hydronephrosis.  Abdominal aorta:  Normal caliber.  Other findings:  None.  IMPRESSION: 1. Cholelithiasis and sonographic findings suggesting acute cholecystitis. There is also mild common bile duct dilatation. 2. Small amount of perihepatic fluid. 3. Limited visualization of the pancreas. 4. Normal spleen and kidneys.   Electronically Signed   By: Loralie ChampagneMark  Gallerani M.D.   On: 10/29/2013 22:16   Dg Ercp Biliary & Pancreatic Ducts  11/06/2013   CLINICAL DATA:  ERCP with sphincterotomy  EXAM: ERCP  TECHNIQUE: Multiple spot images obtained with the fluoroscopic device and submitted for interpretation post-procedure.  COMPARISON:  Abdominal ultrasound - 10/29/2013; intraoperative cholangiogram - 11/02/2013.  FLUOROSCOPY TIME:  2 min, 2 seconds  FINDINGS: Seven spot intraoperative radiographic images of the right upper abdominal quadrant during ERCP are provided for review.  Initial image demonstrates an ERCP probe overlying the right upper abdominal quadrant. There is selective cannulation  opacification of the common bile duct which appears mildly dilated. No discrete filling defects are seen within the CBD.  Surgical clips overlie the gallbladder fossa.  Subsequent images demonstrate insufflation of a balloon within the mid aspect of the CBD with is subsequent sweeping and presumed sphincterotomy. Completion image demonstrates decompression of the CBD with a residual presumed air bubble within distal aspect of the CBD. There is minimal opacification of the residual aspect of the cystic duct.  Contrast is seen within in the descending and horizontal segments of the duodenum.  There is minimal opacification of the central aspect of the intrahepatic biliary tree which appears nondilated.  IMPRESSION: ERCP with biliary sweeping and sphincterotomy as above.  These images were submitted for radiologic interpretation only. Please see the procedural report for the amount of contrast and the fluoroscopy time utilized.   Electronically Signed   By: Simonne ComeJohn  Watts M.D.   On: 11/06/2013 15:10    Microbiology: Recent Results (from the past 240 hour(s))  SURGICAL PCR SCREEN     Status: None   Collection Time    11/01/13  7:33 PM      Result Value Ref Range Status   MRSA, PCR NEGATIVE  NEGATIVE Final   Staphylococcus aureus NEGATIVE  NEGATIVE Final   Comment:  The Xpert SA Assay (FDA     approved for NASAL specimens     in patients over 22 years of age),     is one component of     a comprehensive surveillance     program.  Test performance has     been validated by [employer] for patients greater     than or equal to 42 year old.     It is not intended     to diagnose infection nor to     guide or monitor treatment.     Labs: Basic Metabolic Panel:  Recent Labs Lab 11/01/13 0520 11/02/13 0535 11/03/13 0437 11/07/13 0405  NA 137 137 138 138  K 3.7 3.9 4.3 3.9  CL 103 102 105 103  CO2 26 25 22 25   GLUCOSE 107* 96 130* 99  BUN 6 7 7 9   CREATININE 0.90 0.93 0.75  0.88  CALCIUM 8.3* 8.3* 8.6 8.6   Liver Function Tests:  Recent Labs Lab 11/03/13 0437 11/04/13 0445 11/05/13 0515 11/06/13 0425 11/07/13 0405  AST 126* 197* 194* 112* 71*  ALT 329* 420* 462* 374* 286*  ALKPHOS 127* 111 122* 111 100  BILITOT 0.3 0.2* 0.3 <0.2* 0.3  PROT 7.0 6.3 6.5 6.2 6.3  ALBUMIN 2.8* 2.7* 2.6* 2.6* 2.6*    Recent Labs Lab 11/01/13 0520 11/02/13 0535  LIPASE 50 43   No results found for this basename: AMMONIA,  in the last 168 hours CBC:  Recent Labs Lab 11/01/13 0520 11/02/13 0535  WBC 10.6* 9.0  HGB 11.2* 10.8*  HCT 33.7* 32.5*  MCV 86.2 86.9  PLT 245 242   Cardiac Enzymes: No results found for this basename: CKTOTAL, CKMB, CKMBINDEX, TROPONINI,  in the last 168 hours BNP: BNP (last 3 results) No results found for this basename: PROBNP,  in the last 8760 hours CBG: No results found for this basename: GLUCAP,  in the last 168 hours  Principal Problem:   Gallstone pancreatitis   Time coordinating discharge: <30 mins  Signed:  Kidada Ging, ANP-BC

## 2013-11-07 NOTE — Discharge Summary (Signed)
Pt seen and examined No abd pain. No n/v. Tolerated diet.   Ok for [employer] Discussed Advice workerdc instruction F/u Rosenbower with outpt LFTs  Mary Sellaric M. Andrey CampanileWilson, MD, FACS General, Bariatric, & Minimally Invasive Surgery [facility] [place]

## 2013-11-25 ENCOUNTER — Other Ambulatory Visit (INDEPENDENT_AMBULATORY_CARE_PROVIDER_SITE_OTHER): Payer: Self-pay | Admitting: General Surgery

## 2013-11-25 LAB — HEPATIC FUNCTION PANEL
ALBUMIN: 3.7 g/dL (ref 3.5–5.2)
ALK PHOS: 83 U/L (ref 39–117)
ALT: 21 U/L (ref 0–35)
AST: 17 U/L (ref 0–37)
BILIRUBIN DIRECT: 0.1 mg/dL (ref 0.0–0.3)
Indirect Bilirubin: 0.3 mg/dL (ref 0.2–1.2)
Total Bilirubin: 0.4 mg/dL (ref 0.2–1.2)
Total Protein: 6.8 g/dL (ref 6.0–8.3)

## 2013-11-27 ENCOUNTER — Telehealth (INDEPENDENT_AMBULATORY_CARE_PROVIDER_SITE_OTHER): Payer: Self-pay

## 2013-11-27 NOTE — Telephone Encounter (Signed)
Pt made aware that LFT's are trending down.  Dr. Abbey Chattersosenbower would like her to have labs drawn again in two weeks.  Will call the patient with results.  Orders in Epic.

## 2015-02-02 ENCOUNTER — Ambulatory Visit: Payer: No Typology Code available for payment source | Admitting: Internal Medicine

## 2015-03-23 ENCOUNTER — Ambulatory Visit: Payer: Self-pay | Admitting: Physician Assistant

## 2015-03-23 ENCOUNTER — Encounter: Payer: Self-pay | Admitting: Physician Assistant

## 2015-03-23 VITALS — BP 120/80 | HR 69 | Temp 98.3°F

## 2015-03-23 DIAGNOSIS — H6504 Acute serous otitis media, recurrent, right ear: Secondary | ICD-10-CM

## 2015-03-23 MED ORDER — PREDNISONE 10 MG PO TABS: 30.0000 mg | ORAL_TABLET | Freq: Every day | ORAL | Status: DC

## 2015-03-23 MED ORDER — AMOXICILLIN 400 MG/5ML PO SUSR: 800.0000 mg | Freq: Two times a day (BID) | ORAL | Status: DC

## 2015-03-23 NOTE — Progress Notes (Signed)
S:  C/o ears popping and being stopped up, no drainage from ears, no fever/chills, no cough or congestion, some sinus pressure, remainder ros neg, did have head cold last week but that has improved, ear feels clogged Using otc meds without relief  O:  Vitals wnl, nad, tms dull b/l, r tm pink;  nasal mucosa swollen, throat wnl, neck supple no lymph, lungs c t a, cv rrr, neuro intact  A: acute otitis media  P: amoxil 400/5 2 tsp bid x 10d, sudafed, prednisone  qd x 3d, return if not improving in 3 to 5 days, return earlier if worsening

## 2016-09-05 ENCOUNTER — Ambulatory Visit: Payer: Self-pay | Admitting: Physician Assistant

## 2016-09-05 ENCOUNTER — Encounter: Payer: Self-pay | Admitting: Physician Assistant

## 2016-09-05 DIAGNOSIS — J0100 Acute maxillary sinusitis, unspecified: Secondary | ICD-10-CM

## 2016-09-05 MED ORDER — AMOXICILLIN 400 MG/5ML PO SUSR: 800.0000 mg | Freq: Two times a day (BID) | ORAL | 0 refills | Status: DC

## 2016-09-05 MED ORDER — PREDNISONE 10 MG PO TABS: 30.0000 mg | ORAL_TABLET | Freq: Every day | ORAL | 0 refills | Status: DC

## 2016-09-05 NOTE — Progress Notes (Signed)
S: C/o runny nose and congestion for 4-5 days, no fever, chills, cp/sob, v/d; mucus is green and thick, cough is sporadic, c/o of facial and dental pain. Some ear pressure, hasn't been on antibiotics in over a year  Using otc meds:   O: PE: vitals wnl, nad, perrl eomi, normocephalic, tms dull, nasal mucosa red and swollen, throat injected, neck supple no lymph, lungs c t a, cv rrr, neuro intact  A:  Acute sinusitis   P: drink fluids, continue regular meds , use otc meds of choice, return if not improving in 5 days, return earlier if worsening , amoxil 400/5 2 tsp bid x 10d, pred 30mg  qd x 3d

## 2016-12-27 ENCOUNTER — Ambulatory Visit: Payer: Self-pay | Admitting: Physician Assistant

## 2016-12-27 VITALS — BP 139/80 | HR 74 | Temp 98.5°F | Resp 16

## 2016-12-27 DIAGNOSIS — J0100 Acute maxillary sinusitis, unspecified: Secondary | ICD-10-CM

## 2016-12-27 MED ORDER — AZITHROMYCIN 250 MG PO TABS: ORAL_TABLET | ORAL | 0 refills | Status: DC

## 2016-12-27 NOTE — Progress Notes (Signed)
S: Planes of head congestion, sinus pain, yellow to green mucus, head just feels stuffed up, some ear pressure when she blows her nose, mild cough but thinks is from postnasal drip, no fever or chills, no chest pain or shortness breath  O: Vitals are normal, TMs are dull and a little pink bilaterally, nasal mucosa is bright red and swollen, throat is normal, neck is supple, no lymphadenopathy noted, lungs are clear to auscultation, heart sounds are normal  A: Sinusitis  P: Z-Pak

## 2017-06-28 ENCOUNTER — Ambulatory Visit: Payer: Self-pay | Admitting: Family Medicine

## 2017-06-28 VITALS — BP 138/92 | HR 75 | Temp 98.3°F | Resp 16

## 2017-06-28 DIAGNOSIS — J309 Allergic rhinitis, unspecified: Secondary | ICD-10-CM

## 2017-06-28 MED ORDER — FLUTICASONE PROPIONATE 50 MCG/ACT NA SUSP: 2.0000 | Freq: Every day | NASAL | 1 refills | Status: DC

## 2017-06-28 NOTE — Progress Notes (Signed)
Subjective: cough     Erica Mayer is a 27 y.o. female who presents for evaluation of nasal congestion and nonproductive cough for 5 days.  Patient initially had a sore throat but this has resolved.  Patient reports symptoms have improved significantly in the last 48 hours.  Patient reports that this is her first year with symptoms of seasonal allergic rhinitis but that she has not attempted any treatment for this. Treatment to date: Nasal saline spray.  Denies rash, nausea, vomiting, diarrhea, SOB, wheezing, chest or back pain, ear pain, sore throat, difficulty swallowing, confusion, purulent nasal discharge, anosmia/hyposmia, dental pain, facial pressure/unilateral facial pain, pain exacerbated by bending over, headache, body aches, fatigue, fever, chills, severe symptoms, or initial improvement and then worsening of symptoms. History of smoking, asthma, COPD: Asthma as a child, no issues since then.  Smokes a pack of Black and Milds each week. History of recurrent sinus and/or lung infections: Negative. Medical history: Denies any.  Review of Systems Pertinent items noted in HPI and remainder of comprehensive ROS otherwise negative.     Objective:   Physical Exam General: Awake, alert, and oriented. No acute distress. Well developed, hydrated and nourished. Appears stated age. Nontoxic appearance.  HEENT:  PND noted.  No erythema to posterior oropharynx.  No edema or exudates of pharynx or tonsils. No erythema or bulging of TM.  Mild erythema/edema to nasal mucosa. Sinuses nontender. Supple neck without adenopathy. Cardiac: Heart rate and rhythm are normal. No murmurs, gallops, or rubs are auscultated. S1 and S2 are heard and are of normal intensity.  Respiratory: No signs of respiratory distress. Lungs clear. No tachypnea. Able to speak in full sentences without dyspnea. Nonlabored respirations.  Skin: Skin is warm, dry and intact. Appropriate color for ethnicity. No cyanosis noted.     Assessment:    allergic rhinitis and viral upper respiratory illness   Plan:    Discussed diagnosis and treatment of URI. Discussed the importance of avoiding unnecessary antibiotic therapy. Suggested symptomatic OTC remedies. Nasal saline spray for congestion. Nasal steroids per orders. Discussed side/adverse effects. Discussed red flag symptoms and circumstances with which to seek medical care.   New Prescriptions   FLUTICASONE (FLONASE) 50 MCG/ACT NASAL SPRAY    Place 2 sprays into both nostrils daily.

## 2017-07-17 ENCOUNTER — Ambulatory Visit: Payer: Self-pay | Admitting: Family Medicine

## 2017-07-17 VITALS — BP 130/88 | HR 81 | Resp 20 | Ht 69.0 in | Wt 377.0 lb

## 2017-07-17 DIAGNOSIS — Z0189 Encounter for other specified special examinations: Principal | ICD-10-CM

## 2017-07-17 DIAGNOSIS — Z008 Encounter for other general examination: Secondary | ICD-10-CM

## 2017-07-17 NOTE — Progress Notes (Signed)
Subjective: Annual biometrics screening  Patient presents for her annual biometric screening. Patient denies eating a healthy, well-rounded diet or getting regular exercise.  Patient does not regularly see a primary care provider PCP: None currently. Patient denies any other issues or concerns.   Review of Systems Unremarkable  Objective  Physical Exam General: Awake, alert and oriented. No acute distress. Well developed, hydrated and nourished. Appears stated age.  HEENT: Supple neck without adenopathy. Sclera is non-icteric. The ear canal is clear without discharge. The tympanic membrane is normal in appearance with normal landmarks and cone of light. Nasal mucosa is pink and moist. Oral mucosa is pink and moist. The pharynx is normal in appearance without tonsillar swelling or exudates.  Skin: Skin in warm, dry and intact without rashes or lesions. Appropriate color for ethnicity. Cardiac: Heart rate and rhythm are normal. No murmurs, gallops, or rubs are auscultated.  Respiratory: The chest wall is symmetric and without deformity. No signs of respiratory distress. Lung sounds are clear in all lobes bilaterally without rales, ronchi, or wheezes.  Neurological: The patient is awake, alert and oriented to person, place, and time with normal speech.  Memory is normal and thought processes intact. No gait abnormalities are appreciated.  Psychiatric: Appropriate mood and affect.   Assessment Annual biometrics screening  Plan  Lipid panel and fasting blood sugar pending. Encouraged routine visits with primary care provider.  Provided patient with local resources and encouraged her to establish care with a primary care provider within the next month. Encouraged patient to get regular exercise and eat a healthy, well-rounded diet.

## 2017-07-18 LAB — LIPID PANEL
Chol/HDL Ratio: 2.5 ratio (ref 0.0–4.4)
Cholesterol, Total: 135 mg/dL (ref 100–199)
HDL: 53 mg/dL (ref >39)
LDL Calculated: 72 mg/dL (ref 0–99)
Triglycerides: 51 mg/dL (ref 0–149)
VLDL Cholesterol Cal: 10 mg/dL (ref 5–40)

## 2017-07-18 LAB — GLUCOSE, RANDOM: Glucose: 101 mg/dL — ABNORMAL HIGH (ref 65–99)

## 2017-07-18 NOTE — Progress Notes (Signed)
Erica Mayer, Will you call the patient and inform them that their lipid panel and fasting blood sugar came back?  Everything is normal, with the exception of her fasting blood sugar.  Her blood sugar was slightly elevated at 101, normal values are between 65 and 99.  This may represent "prediabetes", but further evaluation is necessary. Please advise the patient to follow-up with their primary care provider regarding these results.

## 2017-10-04 LAB — LIPID PANEL
Cholesterol: 132 (ref 0–200)
HDL: 46 (ref 35–70)
LDL Cholesterol: 69
Triglycerides: 82 (ref 40–160)

## 2017-10-04 LAB — BASIC METABOLIC PANEL
BUN: 11 (ref 4–21)
Creatinine: 0.9 (ref 0.5–1.1)

## 2017-10-04 LAB — HEMOGLOBIN A1C: Hemoglobin A1C: 6.1

## 2017-10-04 LAB — TSH: TSH: 1.29 (ref 0.41–5.90)

## 2018-03-05 ENCOUNTER — Telehealth: Payer: Self-pay | Admitting: Internal Medicine

## 2018-03-05 NOTE — Telephone Encounter (Signed)
Dr Yetta Barre,  Would you be willing to see this patient to establish care? (see message below)

## 2018-03-05 NOTE — Telephone Encounter (Signed)
Copied from CRM [id number]. Topic: Appointment Scheduling - Scheduling Inquiry for Clinic >> Mar 05, 2018  1:25 PM Leafy Ro wrote: Reason for CRM: pt mom is dr Yetta Barre patient her names is Erica Mayer. Pt would like to see if dr Yetta Barre will accept her daughter. Pt has [employer]

## 2018-03-05 NOTE — Telephone Encounter (Signed)
Yes, I will see her 

## 2018-03-07 NOTE — Telephone Encounter (Signed)
Appointment scheduled. °New patient paperwork mailed.  °

## 2018-04-01 ENCOUNTER — Ambulatory Visit: Payer: Self-pay | Admitting: Internal Medicine

## 2018-04-03 ENCOUNTER — Other Ambulatory Visit (INDEPENDENT_AMBULATORY_CARE_PROVIDER_SITE_OTHER): Payer: Managed Care, Other (non HMO)

## 2018-04-03 ENCOUNTER — Encounter: Payer: Self-pay | Admitting: Internal Medicine

## 2018-04-03 ENCOUNTER — Ambulatory Visit: Payer: Managed Care, Other (non HMO) | Admitting: Internal Medicine

## 2018-04-03 DIAGNOSIS — D539 Nutritional anemia, unspecified: Secondary | ICD-10-CM | POA: Diagnosis not present

## 2018-04-03 DIAGNOSIS — I10 Essential (primary) hypertension: Secondary | ICD-10-CM

## 2018-04-03 DIAGNOSIS — N920 Excessive and frequent menstruation with regular cycle: Secondary | ICD-10-CM | POA: Diagnosis not present

## 2018-04-03 DIAGNOSIS — R7303 Prediabetes: Secondary | ICD-10-CM

## 2018-04-03 DIAGNOSIS — E538 Deficiency of other specified B group vitamins: Secondary | ICD-10-CM

## 2018-04-03 DIAGNOSIS — E559 Vitamin D deficiency, unspecified: Secondary | ICD-10-CM

## 2018-04-03 LAB — COMPREHENSIVE METABOLIC PANEL
ALT: 30 U/L (ref 0–35)
AST: 20 U/L (ref 0–37)
Albumin: 4.1 g/dL (ref 3.5–5.2)
Alkaline Phosphatase: 93 U/L (ref 39–117)
BUN: 9 mg/dL (ref 6–23)
CHLORIDE: 107 meq/L (ref 96–112)
CO2: 27 mEq/L (ref 19–32)
Calcium: 8.6 mg/dL (ref 8.4–10.5)
Creatinine, Ser: 0.96 mg/dL (ref 0.40–1.20)
GFR: 83.2 mL/min (ref >60.00)
GLUCOSE: 95 mg/dL (ref 70–99)
Potassium: 4.4 mEq/L (ref 3.5–5.1)
Sodium: 140 mEq/L (ref 135–145)
Total Bilirubin: 0.3 mg/dL (ref 0.2–1.2)
Total Protein: 7.4 g/dL (ref 6.0–8.3)

## 2018-04-03 LAB — CBC WITH DIFFERENTIAL/PLATELET
BASOS PCT: 1.3 % (ref 0.0–3.0)
Basophils Absolute: 0.1 10*3/uL (ref 0.0–0.1)
EOS ABS: 0.1 10*3/uL (ref 0.0–0.7)
Eosinophils Relative: 1.4 % (ref 0.0–5.0)
HEMATOCRIT: 39.6 % (ref 36.0–46.0)
Hemoglobin: 13.1 g/dL (ref 12.0–15.0)
LYMPHS PCT: 28.6 % (ref 12.0–46.0)
Lymphs Abs: 2.5 10*3/uL (ref 0.7–4.0)
MCHC: 33 g/dL (ref 30.0–36.0)
MCV: 88.3 fl (ref 78.0–100.0)
MONOS PCT: 7.5 % (ref 3.0–12.0)
Monocytes Absolute: 0.6 10*3/uL (ref 0.1–1.0)
NEUTROS PCT: 61.2 % (ref 43.0–77.0)
Neutro Abs: 5.3 10*3/uL (ref 1.4–7.7)
PLATELETS: 285 10*3/uL (ref 150.0–400.0)
RBC: 4.48 Mil/uL (ref 3.87–5.11)
RDW: 15.2 % (ref 11.5–15.5)
WBC: 8.7 10*3/uL (ref 4.0–10.5)

## 2018-04-03 LAB — IBC PANEL
Iron: 54 ug/dL (ref 42–145)
Saturation Ratios: 16.4 % — ABNORMAL LOW (ref 20.0–50.0)
Transferrin: 235 mg/dL (ref 212.0–360.0)

## 2018-04-03 LAB — URINALYSIS, ROUTINE W REFLEX MICROSCOPIC
Bilirubin Urine: NEGATIVE
Ketones, ur: NEGATIVE
LEUKOCYTE UA: NEGATIVE
Nitrite: NEGATIVE
SPECIFIC GRAVITY, URINE: 1.025 (ref 1.000–1.030)
Total Protein, Urine: NEGATIVE
Urine Glucose: NEGATIVE
Urobilinogen, UA: 0.2 (ref 0.0–1.0)
pH: 6 (ref 5.0–8.0)

## 2018-04-03 LAB — HEMOGLOBIN A1C: Hgb A1c MFr Bld: 5.7 % (ref 4.6–6.5)

## 2018-04-03 MED ORDER — HYDROCHLOROTHIAZIDE 25 MG PO TABS: ORAL_TABLET | ORAL | 0 refills | Status: DC

## 2018-04-03 NOTE — Patient Instructions (Signed)

## 2018-04-03 NOTE — Progress Notes (Signed)
Subjective:  Patient ID: Erica Mayer, female    DOB: [birth year]  Age: 28 y.o. MRN: [id number]  CC: Annual Exam; Anemia; and Hypertension   HPI Erica Mayer presents for a CPX.  She had lab work done about 4-1/2 years ago that showed that she was moderately anemic.  She has a history of hypertension.  She ran out of hydrochlorothiazide about 5 days ago and since then her blood pressure has not been well controlled.  History Audyn has a past medical history of Abnormal Pap smear (08/2011), Obesity, Oligomenorrhea, PCOS (polycystic ovarian syndrome), and Weight gain (09/09/2008).   She has a past surgical history that includes Cholecystectomy (N/A, 11/02/2013) and ERCP (N/A, 11/06/2013).   Her family history includes Cancer in her paternal aunt; Diabetes in her father; Heart disease in her father; Hypertension in her father; Kidney disease in her mother.She reports that she has been smoking cigarettes. She has a 3.60 pack-year smoking history. She has never used smokeless tobacco. She reports current alcohol use of about 6.0 standard drinks of alcohol per week. She reports that she does not use drugs.  Outpatient Medications Prior to Visit  Medication Sig Dispense Refill  . hydrochlorothiazide (HYDRODIURIL) 25 MG tablet TK 1 T PO QD IN THE MORNING    . azithromycin (ZITHROMAX Z-PAK) 250 MG tablet 2 pills today then 1 pill a day for 4 days 6 each 0  . fluticasone (FLONASE) 50 MCG/ACT nasal spray Place 2 sprays into both nostrils daily. 16 g 1   No facility-administered medications prior to visit.     ROS Review of Systems  Constitutional: Negative.  Negative for diaphoresis, fatigue and unexpected weight change.  HENT: Negative.   Eyes: Negative for visual disturbance.  Respiratory: Negative for apnea, cough, chest tightness, shortness of breath and wheezing.   Cardiovascular: Negative for chest pain, palpitations and leg swelling.  Gastrointestinal: Negative for abdominal pain, anal  bleeding, blood in stool, constipation, diarrhea, nausea and vomiting.  Endocrine: Positive for cold intolerance. Negative for heat intolerance.  Genitourinary: Positive for menstrual problem and vaginal bleeding. Negative for difficulty urinating, dysuria, pelvic pain, vaginal discharge and vaginal pain.       Her menstrual cycles can last up to 2 weeks  Musculoskeletal: Negative.  Negative for arthralgias and myalgias.  Skin: Negative.  Negative for pallor.  Neurological: Negative for dizziness, weakness, light-headedness, numbness and headaches.  Hematological: Negative for adenopathy. Does not bruise/bleed easily.  Psychiatric/Behavioral: Negative.     Objective:  BP (!) 144/98 (BP Location: Left Arm, Patient Position: Sitting, Cuff Size: Large)   Pulse 68   Temp 98.1 F (36.7 C) (Oral)   Resp 16   Ht 5\' 9"  (1.753 m)   Wt (!) 362 lb 8 oz (164.4 kg)   LMP 04/03/2018   SpO2 96%   BMI 53.53 kg/m   Physical Exam Constitutional:      Appearance: She is obese. She is not ill-appearing or diaphoretic.  HENT:     Nose: Nose normal. No congestion or rhinorrhea.     Mouth/Throat:     Mouth: Mucous membranes are moist.     Pharynx: Oropharynx is clear. No oropharyngeal exudate or posterior oropharyngeal erythema.  Eyes:     Conjunctiva/sclera: Conjunctivae normal.  Neck:     Musculoskeletal: Normal range of motion and neck supple. No muscular tenderness.  Cardiovascular:     Rate and Rhythm: Normal rate and regular rhythm.     Pulses: Normal pulses.  Heart sounds: No murmur. No gallop.   Pulmonary:     Effort: Pulmonary effort is normal. No respiratory distress.     Breath sounds: Normal breath sounds. No stridor. No wheezing, rhonchi or rales.  Abdominal:     General: Bowel sounds are normal.     Palpations: There is no hepatomegaly, splenomegaly or mass.     Tenderness: There is no abdominal tenderness. There is no guarding.  Musculoskeletal: Normal range of motion.         General: No swelling.     Right lower leg: No edema.     Left lower leg: No edema.  Lymphadenopathy:     Cervical: No cervical adenopathy.  Skin:    General: Skin is warm and dry.     Coloration: Skin is not pale.     Findings: No erythema or rash.  Neurological:     General: No focal deficit present.     Mental Status: She is oriented to person, place, and time. Mental status is at baseline.     Lab Results  Component Value Date   WBC 8.7 04/03/2018   HGB 13.1 04/03/2018   HCT 39.6 04/03/2018   PLT 285.0 04/03/2018   GLUCOSE 95 04/03/2018   CHOL 132 10/04/2017   TRIG 82 10/04/2017   HDL 46 10/04/2017   LDLCALC 69 10/04/2017   ALT 30 04/03/2018   AST 20 04/03/2018   NA 140 04/03/2018   K 4.4 04/03/2018   CL 107 04/03/2018   CREATININE 0.96 04/03/2018   BUN 9 04/03/2018   CO2 27 04/03/2018   TSH 1.56 04/03/2018   HGBA1C 5.7 04/03/2018    Assessment & Plan:   Alieyah was seen today for annual exam, anemia and hypertension.  Diagnoses and all orders for this visit:  Morbid obesity (HCC)- She agrees to improve her lifestyle modifications to lose weight. 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 -     CBC with Differential/Platelet; Future -     Vitamin B12; Future -     IBC panel; Future -     Folate; Future -     Ferritin; Future  Essential hypertension- Her blood pressure is not adequately well controlled.  I will treat the vitamin D deficiency.  Otherwise, her labs are negative for secondary causes or endorgan damage.  She will restart hydrochlorothiazide. -     Comprehensive metabolic panel; Future -     VITAMIN D 25 Hydroxy (Vit-D Deficiency, Fractures); Future -     TSH; Future -     Urinalysis, Routine w reflex microscopic; Future -     hydrochlorothiazide (HYDRODIURIL) 25 MG  tablet; TK 1 T PO QD IN THE MORNING  Prediabetes - Her A1c is at 5.7%.  Medical therapy is not indicated.  Agrees to improve her lifestyle modifications. -     Hemoglobin A1c; Future  Vitamin D deficiency disease -     Cholecalciferol 1.25 MG (50000 UT) capsule; Take 1 capsule (50,000 Units total) by mouth once a week.  B12 deficiency- I have asked her to start parenteral B12 replacement therapy.   I have discontinued Kahlan Ernest's azithromycin and fluticasone. I am also having her start on Cholecalciferol. Additionally, I am having her maintain her hydrochlorothiazide.  Meds ordered this  encounter  Medications  . hydrochlorothiazide (HYDRODIURIL) 25 MG tablet    Sig: TK 1 T PO QD IN THE MORNING    Dispense:  90 tablet    Refill:  0  . Cholecalciferol 1.25 MG (50000 UT) capsule    Sig: Take 1 capsule (50,000 Units total) by mouth once a week.    Dispense:  12 capsule    Refill:  0     Follow-up: Return in about 3 months (around 07/02/2018).  Sanda Linger, MD

## 2018-04-04 ENCOUNTER — Encounter: Payer: Self-pay | Admitting: Internal Medicine

## 2018-04-04 DIAGNOSIS — E559 Vitamin D deficiency, unspecified: Secondary | ICD-10-CM | POA: Insufficient documentation

## 2018-04-04 DIAGNOSIS — E538 Deficiency of other specified B group vitamins: Secondary | ICD-10-CM | POA: Insufficient documentation

## 2018-04-04 DIAGNOSIS — R7303 Prediabetes: Secondary | ICD-10-CM | POA: Insufficient documentation

## 2018-04-04 LAB — FERRITIN: Ferritin: 32.6 ng/mL (ref 10.0–291.0)

## 2018-04-04 LAB — VITAMIN D 25 HYDROXY (VIT D DEFICIENCY, FRACTURES): VITD: 11.75 ng/mL — AB (ref 30.00–100.00)

## 2018-04-04 LAB — TSH: TSH: 1.56 u[IU]/mL (ref 0.35–4.50)

## 2018-04-04 LAB — FOLATE: Folate: 7 ng/mL (ref >5.9)

## 2018-04-04 LAB — VITAMIN B12: Vitamin B-12: 106 pg/mL — ABNORMAL LOW (ref 211–911)

## 2018-04-04 MED ORDER — CHOLECALCIFEROL 1.25 MG (50000 UT) PO CAPS: 50000.0000 [IU] | ORAL_CAPSULE | ORAL | 0 refills | Status: DC

## 2018-04-12 ENCOUNTER — Other Ambulatory Visit: Payer: Self-pay | Admitting: Internal Medicine

## 2018-04-16 ENCOUNTER — Ambulatory Visit: Payer: Self-pay

## 2018-04-16 ENCOUNTER — Telehealth: Payer: Self-pay

## 2018-04-16 ENCOUNTER — Ambulatory Visit (INDEPENDENT_AMBULATORY_CARE_PROVIDER_SITE_OTHER): Payer: Managed Care, Other (non HMO)

## 2018-04-16 DIAGNOSIS — E538 Deficiency of other specified B group vitamins: Secondary | ICD-10-CM

## 2018-04-16 MED ORDER — CYANOCOBALAMIN 1000 MCG/ML IJ SOLN
1000.0000 ug | Freq: Once | INTRAMUSCULAR | Status: AC
  Administered 2018-04-16: 1000 ug via INTRAMUSCULAR

## 2018-04-16 NOTE — Telephone Encounter (Signed)
Routing to dr jones---patient has been taught how to administer b12 injections (nurse visit today)---can you please send vitamin b12 rx to [facility], including syringes so that she can begin b12 injections at home---thanks

## 2018-04-16 NOTE — Progress Notes (Signed)
I have reviewed and agree.

## 2018-04-17 MED ORDER — CYANOCOBALAMIN 1000 MCG/ML IJ SOLN: 1000.0000 ug | Freq: Once | INTRAMUSCULAR | 5 refills | Status: AC

## 2018-04-17 MED ORDER — "SYRINGE/NEEDLE (DISP) 25G X 5/8"" 3 ML MISC": 5 refills | Status: DC

## 2018-04-17 NOTE — Telephone Encounter (Signed)
Yes, I just sent it in.  Pt came in yesterday and we showed her how to inject at home.

## 2018-04-17 NOTE — Telephone Encounter (Signed)
Stef - Do we have an order set for this?  TJ

## 2018-04-17 NOTE — Addendum Note (Signed)
Addended by: Radford Pax M on: 04/17/2018 10:09 AM   Modules accepted: Orders

## 2018-08-19 ENCOUNTER — Encounter: Payer: Self-pay | Admitting: Adult Health

## 2018-08-19 ENCOUNTER — Other Ambulatory Visit: Payer: Self-pay

## 2018-08-19 ENCOUNTER — Ambulatory Visit: Payer: Managed Care, Other (non HMO) | Admitting: Adult Health

## 2018-08-19 DIAGNOSIS — N926 Irregular menstruation, unspecified: Secondary | ICD-10-CM | POA: Insufficient documentation

## 2018-08-19 DIAGNOSIS — B009 Herpesviral infection, unspecified: Secondary | ICD-10-CM | POA: Insufficient documentation

## 2018-08-19 DIAGNOSIS — R399 Unspecified symptoms and signs involving the genitourinary system: Secondary | ICD-10-CM | POA: Diagnosis not present

## 2018-08-19 DIAGNOSIS — E282 Polycystic ovarian syndrome: Secondary | ICD-10-CM | POA: Insufficient documentation

## 2018-08-19 MED ORDER — CEPHALEXIN 500 MG PO CAPS: 500.0000 mg | ORAL_CAPSULE | Freq: Two times a day (BID) | ORAL | 0 refills | Status: DC

## 2018-08-19 NOTE — Progress Notes (Addendum)
Virtual Visit via Video Note  I connected with Tekila Caillouet on 08/19/18 at 10:30 AM EDT by a video enabled telemedicine application and verified that I am speaking with the correct person using two identifiers.  Location: Patient: at home  Provider: [facility] ([place] [place], [place]     I discussed the limitations of evaluation and management by telemedicine and the availability of in person appointments. The patient expressed understanding and agreed to proceed.  History of Present Illness: Patient is a 28 year old female in no acute distress who calls the clinic for a telephone visit, during the COVID-19 pandemic.  She prefers not to come in the office. Reports that she has had 2 days of urinary frequency with burning.  She also reported on her MyChart questionnaire that she had a white vaginal discharge, when provider asked her about this she said it is more of a moist vaginal feeling and she feels like it urine. She denies any vaginal symptoms, no itching no burning or no lesions.  She does report that her urine has a stronger odor to it and is normal color.  Denies any hematuria. She reports this feels like a urinary tract infection that she has had in the past and was treated with antibiotics. She denies any allergies. LMP 08/02/18 she denies any pregnancy concerns.   Reports that she has no concerns with sexually transmitted diseases at this time.  She denies history of STDs.  Patient  denies any fever, body aches,chills, rash, chest pain, shortness of breath, nausea, vomiting, or diarrhea.  She denies any abdominal or back pain.    Observations/Objective: she reports she has been afebrile. She has no other vital signs available.   Patient is alert and oriented and responsive to questions Engages in conversation with provider. Speaks in full sentences without any pauses without any shortness of breath or distress.     Assessment and Plan: 1.  Urinary symptom or sign     Meds ordered this encounter  Medications  . cephALEXin (KEFLEX) 500 MG capsule    Sig: Take 1 capsule (500 mg total) by mouth 2 (two) times daily.    Dispense:  14 capsule    Refill:  0     Follow Up Instructions: After Visit summary to My Chart  Discussed may use over the counter AZO per package instructions for urinary discomfort and not to use any longer than 3 days.  I discussed the assessment and treatment plan with the patient. The patient was provided an opportunity to ask questions and all were answered. The patient agreed with the plan and demonstrated an understanding of the instructions.   The patient was advised to call back or seek an in-person evaluation if the symptoms worsen or if the condition fails to improve as anticipated. Advised patient call the office or your primary care doctor for an appointment if no improvement within 72 hours or if any symptoms change or worsen at any time  Advised ER or urgent Care if after hours or on weekend. Call 911 for emergency symptoms at any time.Patinet verbalized understanding of all instructions given/reviewed and treatment plan and has no further questions or concerns at this time.     I provided 15 minutes of non-face-to-face time during this encounter.   Marcille Buffy, FNP

## 2018-08-19 NOTE — Patient Instructions (Addendum)
Call clinic if odor continues  or any symptoms persist with treatment or as below as discussed.  Advised patient call the office or your primary care doctor for an appointment if no improvement within 72 hours or if any symptoms change or worsen at any time  Advised ER or urgent Care if after hours or on weekend. Call 911 for emergency symptoms at any time.Patinet verbalized understanding of all instructions given/reviewed and treatment plan and has no further questions or concerns at this time.      Phenazopyridine tablets - NOT TO USE LONGER THAN 3 DAYS.  What is this medicine? PHENAZOPYRIDINE (fen az oh PEER i deen) is a pain reliever. It is used to stop the pain, burning, or discomfort caused by infection or irritation of the urinary tract. This medicine is not an antibiotic. It will not cure a urinary tract infection. This medicine may be used for other purposes; ask your health care provider or pharmacist if you have questions. COMMON BRAND NAME(S): AZO, Azo-100, Azo-Gesic, Azo-Septic, Azo-Standard, Phenazo, Prodium, Pyridium, Urinary Analgesic, Uristat, Uristat Ultra What should I tell my health care provider before I take this medicine? They need to know if you have any of these conditions:  glucose-6-phosphate dehydrogenase (G6PD) deficiency  kidney disease  an unusual or allergic reaction to phenazopyridine, other medicines, foods, dyes, or preservatives  pregnant or trying to get pregnant  breast-feeding How should I use this medicine? Take this medicine by mouth with a glass of water. Follow the directions on the prescription label. Take after meals. Take your doses at regular intervals. Do not take your medicine more often than directed. Do not skip doses or stop your medicine early even if you feel better. Do not stop taking except on your doctor's advice. Talk to your pediatrician regarding the use of this medicine in children. Special care may be needed. Overdosage: If you  think you have taken too much of this medicine contact a poison control center or emergency room at once. NOTE: This medicine is only for you. Do not share this medicine with others. What if I miss a dose? If you miss a dose, take it as soon as you can. If it is almost time for your next dose, take only that dose. Do not take double or extra doses. What may interact with this medicine? Interactions are not expected. This list may not describe all possible interactions. Give your health care provider a list of all the medicines, herbs, non-prescription drugs, or dietary supplements you use. Also tell them if you smoke, drink alcohol, or use illegal drugs. Some items may interact with your medicine. What should I watch for while using this medicine? Tell your doctor or health care professional if your symptoms do not improve or if they get worse. This medicine colors body fluids red. This effect is harmless and will go away after you are done taking the medicine. It will change urine to an dark orange or red color. The red color may stain clothing. Soft contact lenses may become permanently stained. It is best not to wear soft contact lenses while taking this medicine. If you are diabetic you may get a false positive result for sugar in your urine. Talk to your health care provider. What side effects may I notice from receiving this medicine? Side effects that you should report to your doctor or health care professional as soon as possible:  allergic reactions like skin rash, itching or hives, swelling of the face, lips,  or tongue  blue or purple color of the skin  difficulty breathing  fever  less urine  unusual bleeding, bruising  unusual tired, weak  vomiting  yellowing of the eyes or skin Side effects that usually do not require medical attention (report to your doctor or health care professional if they continue or are bothersome):  dark urine  headache  stomach upset This  list may not describe all possible side effects. Call your doctor for medical advice about side effects. You may report side effects to FDA at 1-800-FDA-1088. Where should I keep my medicine? Keep out of the reach of children. Store at room temperature between 15 and 30 degrees C (59 and 86 degrees F). Protect from light and moisture. Throw away any unused medicine after the expiration date. NOTE: This sheet is a summary. It may not cover all possible information. If you have questions about this medicine, talk to your doctor, pharmacist, or health care provider.  2020 Elsevier/Gold Standard (2007-08-22 11:04:07) Cephalexin tablets or capsules What is this medicine? CEPHALEXIN (sef a LEX in) is a cephalosporin antibiotic. It is used to treat certain kinds of bacterial infections It will not work for colds, flu, or other viral infections. This medicine may be used for other purposes; ask your health care provider or pharmacist if you have questions. COMMON BRAND NAME(S): Biocef, Daxbia, Keflex, Keftab What should I tell my health care provider before I take this medicine? They need to know if you have any of these conditions:  kidney disease  stomach or intestine problems, especially colitis  an unusual or allergic reaction to cephalexin, other cephalosporins, penicillins, other antibiotics, medicines, foods, dyes or preservatives  pregnant or trying to get pregnant  breast-feeding How should I use this medicine? Take this medicine by mouth with a full glass of water. Follow the directions on the prescription label. This medicine can be taken with or without food. Take your medicine at regular intervals. Do not take your medicine more often than directed. Take all of your medicine as directed even if you think you are better. Do not skip doses or stop your medicine early. Talk to your pediatrician regarding the use of this medicine in children. While this drug may be prescribed for selected  conditions, precautions do apply. Overdosage: If you think you have taken too much of this medicine contact a poison control center or emergency room at once. NOTE: This medicine is only for you. Do not share this medicine with others. What if I miss a dose? If you miss a dose, take it as soon as you can. If it is almost time for your next dose, take only that dose. Do not take double or extra doses. There should be at least 4 to 6 hours between doses. What may interact with this medicine?  probenecid  some other antibiotics This list may not describe all possible interactions. Give your health care provider a list of all the medicines, herbs, non-prescription drugs, or dietary supplements you use. Also tell them if you smoke, drink alcohol, or use illegal drugs. Some items may interact with your medicine. What should I watch for while using this medicine? Tell your doctor or health care provider if your symptoms do not begin to improve in a few days. This medicine may cause serious skin reactions. They can happen weeks to months after starting the medicine. Contact your health care provider right away if you notice fevers or flu-like symptoms with a rash. The rash may  be red or purple and then turn into blisters or peeling of the skin. Or, you might notice a red rash with swelling of the face, lips or lymph nodes in your neck or under your arms. Do not treat diarrhea with over the counter products. Contact your doctor if you have diarrhea that lasts more than 2 days or if it is severe and watery. If you have diabetes, you may get a false-positive result for sugar in your urine. Check with your doctor or health care provider. What side effects may I notice from receiving this medicine? Side effects that you should report to your doctor or health care professional as soon as possible:  allergic reactions like skin rash, itching or hives, swelling of the face, lips, or tongue  breathing problems   pain or trouble passing urine  redness, blistering, peeling or loosening of the skin, including inside the mouth  severe or watery diarrhea  unusually weak or tired  yellowing of the eyes, skin Side effects that usually do not require medical attention (report to your doctor or health care professional if they continue or are bothersome):  gas or heartburn  genital or anal irritation  headache  joint or muscle pain  nausea, vomiting This list may not describe all possible side effects. Call your doctor for medical advice about side effects. You may report side effects to FDA at 1-800-FDA-1088. Where should I keep my medicine? Keep out of the reach of children. Store at room temperature between 59 and 86 degrees F (15 and 30 degrees C). Throw away any unused medicine after the expiration date. NOTE: This sheet is a summary. It may not cover all possible information. If you have questions about this medicine, talk to your doctor, pharmacist, or health care provider.  2020 Elsevier/Gold Standard (2018-05-03 07:00:28) Urinary Tract Infection, Adult A urinary tract infection (UTI) is an infection of any part of the urinary tract. The urinary tract includes:  The kidneys.  The ureters.  The bladder.  The urethra. These organs make, store, and get rid of pee (urine) in the body. What are the causes? This is caused by germs (bacteria) in your genital area. These germs grow and cause swelling (inflammation) of your urinary tract. What increases the risk? You are more likely to develop this condition if:  You have a small, thin tube (catheter) to drain pee.  You cannot control when you pee or poop (incontinence).  You are female, and: ? You use these methods to prevent pregnancy: ? A medicine that kills sperm (spermicide). ? A device that blocks sperm (diaphragm). ? You have low levels of a female hormone (estrogen). ? You are pregnant.  You have genes that add to your  risk.  You are sexually active.  You take antibiotic medicines.  You have trouble peeing because of: ? A prostate that is bigger than normal, if you are female. ? A blockage in the part of your body that drains pee from the bladder (urethra). ? A kidney stone. ? A nerve condition that affects your bladder (neurogenic bladder). ? Not getting enough to drink. ? Not peeing often enough.  You have other conditions, such as: ? Diabetes. ? A weak disease-fighting system (immune system). ? Sickle cell disease. ? Gout. ? Injury of the spine. What are the signs or symptoms? Symptoms of this condition include:  Needing to pee right away (urgently).  Peeing often.  Peeing small amounts often.  Pain or burning when peeing.  Blood in the pee.  Pee that smells bad or not like normal.  Trouble peeing.  Pee that is cloudy.  Fluid coming from the vagina, if you are female.  Pain in the belly or lower back. Other symptoms include:  Throwing up (vomiting).  No urge to eat.  Feeling mixed up (confused).  Being tired and grouchy (irritable).  A fever.  Watery poop (diarrhea). How is this treated? This condition may be treated with:  Antibiotic medicine.  Other medicines.  Drinking enough water. Follow these instructions at home:  Medicines  Take over-the-counter and prescription medicines only as told by your doctor.  If you were prescribed an antibiotic medicine, take it as told by your doctor. Do not stop taking it even if you start to feel better. General instructions  Make sure you: ? Pee until your bladder is empty. ? Do not hold pee for a long time. ? Empty your bladder after sex. ? Wipe from front to back after pooping if you are a female. Use each tissue one time when you wipe.  Drink enough fluid to keep your pee pale yellow.  Keep all follow-up visits as told by your doctor. This is important. Contact a doctor if:  You do not get better after 1-2  days.  Your symptoms go away and then come back. Get help right away if:  You have very bad back pain.  You have very bad pain in your lower belly.  You have a fever.  You are sick to your stomach (nauseous).  You are throwing up. Summary  A urinary tract infection (UTI) is an infection of any part of the urinary tract.  This condition is caused by germs in your genital area.  There are many risk factors for a UTI. These include having a small, thin tube to drain pee and not being able to control when you pee or poop.  Treatment includes antibiotic medicines for germs.  Drink enough fluid to keep your pee pale yellow. This information is not intended to replace advice given to you by your health care provider. Make sure you discuss any questions you have with your health care provider. Document Released: 07/12/2007 Document Revised: 01/10/2018 Document Reviewed: 08/02/2017 Elsevier Patient Education  2020 [employer]

## 2018-09-23 ENCOUNTER — Ambulatory Visit: Payer: Self-pay | Admitting: *Deleted

## 2018-09-23 DIAGNOSIS — Z20822 Contact with and (suspected) exposure to covid-19: Secondary | ICD-10-CM

## 2018-09-23 DIAGNOSIS — Z20828 Contact with and (suspected) exposure to other viral communicable diseases: Secondary | ICD-10-CM

## 2018-09-23 NOTE — Telephone Encounter (Signed)
Contacted pt due to her symptoms; she she woke on 09/22/2018 with congestion, sore throat and chills possible fever but no thermometer to check. Wanting to know should she go get tested since she had to stay out of work; the pt says that she has not come in contact with anyone who tested positive for COVID but some members of her family had runny noses; she also says that she wears a mask at all times and practices social distancing;  recommendations made per nurse triage protocol; she was also advised to clean high touch surface areas; she verbalized understanding' the pt sees Dr Scarlette Calico, [facility] and can be contacted at [phone]; will route to office for final disposition;   Reason for Disposition . [1] COVID-19 infection suspected by caller or triager AND [2] mild symptoms (cough, fever, or others) AND [9] no complications or SOB  Answer Assessment - Initial Assessment Questions 1. COVID-19 DIAGNOSIS: "Who made your Coronavirus (COVID-19) diagnosis?" "Was it confirmed by a positive lab test?" If not diagnosed by a HCP, ask "Are there lots of cases (community spread) where you live?" (See public health department website, if unsure)     Major community spead 2. ONSET: "When did the COVID-19 symptoms start?"    09/22/2018 3. WORST SYMPTOM: "What is your worst symptom?" (e.g., cough, fever, shortness of breath, muscle aches) congestion 4. COUGH: "Do you have a cough?" If so, ask: "How bad is the cough?"     Mild-taken robitussin 5. FEVER: "Do you have a fever?" If so, ask: "What is your temperature, how was it measured, and when did it start?"     Yes, forehead warm to touch 6. RESPIRATORY STATUS: "Describe your breathing?" (e.g., shortness of breath, wheezing, unable to speak)  Breathing ok 7. BETTER-SAME-WORSE: "Are you getting better, staying the same or getting worse compared to yesterday?"  If getting worse, ask, "In what way?"    better 8. HIGH RISK DISEASE: "Do you have any chronic  medical problems?" (e.g., asthma, heart or lung disease, weak immune system, etc.)    High blood pressure; pre-diabetic 9. PREGNANCY: "Is there any chance you are pregnant?" "When was your last menstrual period?"    No LMP 09/05/2018 10. OTHER SYMPTOMS: "Do you have any other symptoms?"  (e.g., chills, fatigue, headache, loss of smell or taste, muscle pain, sore throat)       Sore throat and chills on 09/22/2018  Protocols used: CORONAVIRUS (COVID-19) DIAGNOSED OR SUSPECTED-A-AH

## 2018-09-24 ENCOUNTER — Other Ambulatory Visit: Payer: Self-pay

## 2018-09-24 DIAGNOSIS — Z20822 Contact with and (suspected) exposure to covid-19: Secondary | ICD-10-CM

## 2018-09-24 NOTE — Addendum Note (Signed)
Addended by: Aviva Signs M on: 09/24/2018 10:04 AM   Modules accepted: Orders

## 2018-09-24 NOTE — Telephone Encounter (Signed)
COVID test ordered.

## 2018-09-25 ENCOUNTER — Encounter: Payer: Self-pay | Admitting: Internal Medicine

## 2018-09-25 LAB — NOVEL CORONAVIRUS, NAA: SARS-CoV-2, NAA: NOT DETECTED

## 2018-10-02 ENCOUNTER — Encounter: Payer: Self-pay | Admitting: Internal Medicine

## 2018-10-07 ENCOUNTER — Ambulatory Visit (INDEPENDENT_AMBULATORY_CARE_PROVIDER_SITE_OTHER)
Admission: RE | Admit: 2018-10-07 | Discharge: 2018-10-07 | Disposition: A | Discharge status: Home / Self Care | Payer: Managed Care, Other (non HMO) | Source: Ambulatory Visit | Attending: Internal Medicine | Admitting: Internal Medicine

## 2018-10-07 ENCOUNTER — Other Ambulatory Visit: Payer: Self-pay

## 2018-10-07 ENCOUNTER — Encounter: Payer: Self-pay | Admitting: Internal Medicine

## 2018-10-07 ENCOUNTER — Ambulatory Visit (INDEPENDENT_AMBULATORY_CARE_PROVIDER_SITE_OTHER): Payer: Managed Care, Other (non HMO) | Admitting: Internal Medicine

## 2018-10-07 VITALS — BP 130/70 | HR 76 | Temp 98.7°F | Resp 16 | Ht 69.0 in | Wt 357.0 lb

## 2018-10-07 DIAGNOSIS — R059 Cough, unspecified: Secondary | ICD-10-CM

## 2018-10-07 DIAGNOSIS — B9789 Other viral agents as the cause of diseases classified elsewhere: Secondary | ICD-10-CM

## 2018-10-07 DIAGNOSIS — E538 Deficiency of other specified B group vitamins: Secondary | ICD-10-CM

## 2018-10-07 DIAGNOSIS — J069 Acute upper respiratory infection, unspecified: Secondary | ICD-10-CM | POA: Diagnosis not present

## 2018-10-07 DIAGNOSIS — R05 Cough: Secondary | ICD-10-CM | POA: Diagnosis not present

## 2018-10-07 LAB — POCT EXHALED NITRIC OXIDE: FeNO level (ppb): 9

## 2018-10-07 MED ORDER — "BD INSULIN SYRINGE 25G X 1"" 1 ML MISC": 1 refills | Status: DC

## 2018-10-07 MED ORDER — HYDROCODONE-HOMATROPINE 5-1.5 MG/5ML PO SYRP: 5.0000 mL | ORAL_SOLUTION | Freq: Three times a day (TID) | ORAL | 0 refills | Status: DC | PRN

## 2018-10-07 MED ORDER — CYANOCOBALAMIN 1000 MCG/ML IJ SOLN
100.0000 ug | INTRAMUSCULAR | 1 refills | Status: DC
Start: 2018-10-07 — End: 2019-05-08

## 2018-10-07 MED ORDER — HYDROCODONE-HOMATROPINE 5-1.5 MG/5ML PO SYRP: 5.0000 mL | ORAL_SOLUTION | Freq: Three times a day (TID) | ORAL | 0 refills | Status: AC | PRN

## 2018-10-07 NOTE — Patient Instructions (Signed)

## 2018-10-07 NOTE — Progress Notes (Signed)
Subjective:  Patient ID: Erica Mayer, female    DOB: 1990/07/21  Age: 28 y.o. MRN: [id number]  CC: Cough   HPI Erica Mayer presents for the complaint of a 2-week history of nonproductive cough.  She has had a negative COVID 19 antigen test.  She denies shortness of breath, fever, chills, chest pain, or hemoptysis.  Outpatient Medications Prior to Visit  Medication Sig Dispense Refill   Cholecalciferol 1.25 MG (50000 UT) capsule Take 1 capsule (50,000 Units total) by mouth once a week. 12 capsule 0   hydrochlorothiazide (HYDRODIURIL) 25 MG tablet TK 1 T PO QD IN THE MORNING 90 tablet 0   SYRINGE-NEEDLE, DISP, 3 ML 25G X 5/8" 3 ML MISC Use to inject b12 monthly. 1 each 5   B-D INSULIN SYRINGE 1CC/25GX1" 25G X 1" 1 ML MISC USE TO INJECT B12 MONTHLY     cyanocobalamin (,VITAMIN B-12,) 1000 MCG/ML injection      cephALEXin (KEFLEX) 500 MG capsule Take 1 capsule (500 mg total) by mouth 2 (two) times daily. 14 capsule 0   No facility-administered medications prior to visit.     ROS Review of Systems  Constitutional: Negative for chills, diaphoresis, fatigue and fever.  HENT: Negative.  Negative for sore throat.   Eyes: Negative.   Respiratory: Positive for cough. Negative for chest tightness, shortness of breath and wheezing.   Cardiovascular: Negative for chest pain, palpitations and leg swelling.  Gastrointestinal: Negative for abdominal pain, constipation, diarrhea and nausea.  Endocrine: Negative.   Genitourinary: Negative.  Negative for difficulty urinating.  Musculoskeletal: Negative.  Negative for arthralgias and myalgias.  Skin: Negative.  Negative for color change and rash.  Neurological: Negative.  Negative for dizziness, weakness, light-headedness and headaches.  Hematological: Negative for adenopathy. Does not bruise/bleed easily.  Psychiatric/Behavioral: Negative.     Objective:  BP 130/70 (BP Location: Left Arm, Patient Position: Sitting, Cuff Size: Large)     Pulse 76    Temp 98.7 F (37.1 C) (Oral)    Resp 16    Ht 5\' 9"  (1.753 m)    Wt (!) 357 lb (161.9 kg)    LMP 09/06/2018 (Within Weeks)    SpO2 97%    BMI 52.72 kg/m   BP Readings from Last 3 Encounters:  10/07/18 130/70  04/03/18 (!) 144/98  07/17/17 130/88    Wt Readings from Last 3 Encounters:  10/07/18 (!) 357 lb (161.9 kg)  04/03/18 (!) 362 lb 8 oz (164.4 kg)  07/17/17 (!) 377 lb (171 kg)    Physical Exam Vitals signs reviewed.  Constitutional:      General: She is not in acute distress.    Appearance: She is obese. She is not ill-appearing, toxic-appearing or diaphoretic.  HENT:     Nose: Nose normal.     Mouth/Throat:     Mouth: Mucous membranes are moist.  Eyes:     General: No scleral icterus.    Conjunctiva/sclera: Conjunctivae normal.  Neck:     Musculoskeletal: Normal range of motion and neck supple.  Cardiovascular:     Rate and Rhythm: Normal rate and regular rhythm.     Heart sounds: No murmur.  Pulmonary:     Effort: Pulmonary effort is normal. No respiratory distress.     Breath sounds: No stridor. No wheezing, rhonchi or rales.  Abdominal:     General: Abdomen is protuberant. Bowel sounds are normal. There is no distension.     Palpations: Abdomen is soft. There is no  hepatomegaly or splenomegaly.     Tenderness: There is no abdominal tenderness.  Musculoskeletal: Normal range of motion.     Right lower leg: No edema.     Left lower leg: No edema.  Lymphadenopathy:     Cervical: No cervical adenopathy.  Skin:    General: Skin is warm and dry.     Coloration: Skin is not pale.  Neurological:     General: No focal deficit present.     Mental Status: She is alert and oriented to person, place, and time.     Lab Results  Component Value Date   WBC 8.7 04/03/2018   HGB 13.1 04/03/2018   HCT 39.6 04/03/2018   PLT 285.0 04/03/2018   GLUCOSE 95 04/03/2018   CHOL 132 10/04/2017   TRIG 82 10/04/2017   HDL 46 10/04/2017   LDLCALC 69 10/04/2017    ALT 30 04/03/2018   AST 20 04/03/2018   NA 140 04/03/2018   K 4.4 04/03/2018   CL 107 04/03/2018   CREATININE 0.96 04/03/2018   BUN 9 04/03/2018   CO2 27 04/03/2018   TSH 1.56 04/03/2018   HGBA1C 5.7 04/03/2018    US Abdomen Complete  Result Date: 10/29/2013 CLINICAL DATA:  Abdominal pain. EXAM: ULTRASOUND ABDOMEN COMPLETE COMPARISON:  None. FINDINGS: Gallbladder: Numerous gallstones are noted in the gallbladder with associated acoustic shadowing. The gallbladder wall is mildly thickened. No pericholecystic fluid. Common bile duct: Diameter: 6.9 mm Liver: Normal echogenicity without focal lesions. No intrahepatic biliary dilatation. A small amount of perihepatic fluid is noted. IVC: Normal caliber. Pancreas: Limited visualization due to poor sonographic window. Spleen: Normal size and echogenicity without focal lesions. Right Kidney: Length: 11.2 cm. Normal renal cortical thickness and echogenicity without focal lesions or hydronephrosis. Left Kidney: Length: 12.3 cm. Normal renal cortical thickness and echogenicity without focal lesions or hydronephrosis. Abdominal aorta: Normal caliber. Other findings: None. IMPRESSION: 1. Cholelithiasis and sonographic findings suggesting acute cholecystitis. There is also mild common bile duct dilatation. 2. Small amount of perihepatic fluid. 3. Limited visualization of the pancreas. 4. Normal spleen and kidneys. Electronically Signed   By: Kalman Jewels M.D.   On: 10/29/2013 22:16    Dg Chest 2 View  Result Date: 10/08/2018 CLINICAL DATA:  Cough for 6 weeks.  Smoker.  Hypertension. EXAM: CHEST - 2 VIEW COMPARISON:  None. FINDINGS: The heart size and mediastinal contours are within normal limits. Both lungs are clear. The visualized skeletal structures are unremarkable. IMPRESSION: Negative.  No active cardiopulmonary disease. Electronically Signed   By: Marlaine Hind M.D.   On: 10/08/2018 08:11    Assessment & Plan:   [name] was seen today for  cough.  Diagnoses and all orders for this visit:  B12 deficiency -     B-D INSULIN SYRINGE 1CC/25GX1" 25G X 1" 1 ML MISC; USE TO INJECT B12 MONTHLY -     cyanocobalamin (,VITAMIN B-12,) 1000 MCG/ML injection; Inject 0.1 mLs (100 mcg total) into the muscle every 30 (thirty) days.  Cough- Her FeNO score is low which is reassuring that she is not suffering from an allergic or asthmatic condition.  Her chest x-ray is negative for mass or infiltrate.  I will treat her for viral URI. -     DG Chest 2 View; Future -     POCT EXHALED NITRIC OXIDE  Viral URI with cough -     Discontinue: HYDROcodone-homatropine (HYCODAN) 5-1.5 MG/5ML syrup; Take 5 mLs by mouth every 8 (eight) hours as  needed for cough. -     HYDROcodone-homatropine (HYCODAN) 5-1.5 MG/5ML syrup; Take 5 mLs by mouth every 8 (eight) hours as needed for cough.   I have discontinued Rayvn Herald's cephALEXin. I have also changed her cyanocobalamin. Additionally, I am having her maintain her hydrochlorothiazide, Cholecalciferol, SYRINGE-NEEDLE (DISP) 3 ML, B-D INSULIN SYRINGE 1CC/25GX1", and HYDROcodone-homatropine.  Meds ordered this encounter  Medications   B-D INSULIN SYRINGE 1CC/25GX1" 25G X 1" 1 ML MISC    Sig: USE TO INJECT B12 MONTHLY    Dispense:  3 each    Refill:  1   cyanocobalamin (,VITAMIN B-12,) 1000 MCG/ML injection    Sig: Inject 0.1 mLs (100 mcg total) into the muscle every 30 (thirty) days.    Dispense:  3 mL    Refill:  1   DISCONTD: HYDROcodone-homatropine (HYCODAN) 5-1.5 MG/5ML syrup    Sig: Take 5 mLs by mouth every 8 (eight) hours as needed for cough.    Dispense:  120 mL    Refill:  0   HYDROcodone-homatropine (HYCODAN) 5-1.5 MG/5ML syrup    Sig: Take 5 mLs by mouth every 8 (eight) hours as needed for cough.    Dispense:  120 mL    Refill:  0     Follow-up: Return if symptoms worsen or fail to improve.  Sanda Lingerhomas Marlee Armenteros, MD

## 2018-10-08 ENCOUNTER — Encounter: Payer: Self-pay | Admitting: Internal Medicine

## 2018-10-14 ENCOUNTER — Encounter: Payer: Self-pay | Admitting: Internal Medicine

## 2018-11-07 ENCOUNTER — Encounter: Payer: Self-pay | Admitting: Internal Medicine

## 2018-11-08 ENCOUNTER — Other Ambulatory Visit: Payer: Self-pay | Admitting: Internal Medicine

## 2018-11-08 DIAGNOSIS — I10 Essential (primary) hypertension: Secondary | ICD-10-CM

## 2018-11-08 MED ORDER — HYDROCHLOROTHIAZIDE 25 MG PO TABS: ORAL_TABLET | ORAL | 0 refills | Status: DC

## 2018-11-28 ENCOUNTER — Encounter: Payer: Self-pay | Admitting: Internal Medicine

## 2018-12-04 ENCOUNTER — Encounter: Payer: Self-pay | Admitting: Internal Medicine

## 2018-12-04 ENCOUNTER — Other Ambulatory Visit: Payer: Self-pay

## 2018-12-04 ENCOUNTER — Ambulatory Visit (INDEPENDENT_AMBULATORY_CARE_PROVIDER_SITE_OTHER): Payer: Managed Care, Other (non HMO) | Admitting: Internal Medicine

## 2018-12-04 ENCOUNTER — Other Ambulatory Visit (INDEPENDENT_AMBULATORY_CARE_PROVIDER_SITE_OTHER): Payer: Managed Care, Other (non HMO)

## 2018-12-04 VITALS — BP 128/84 | HR 67 | Temp 98.3°F | Resp 16 | Ht 69.0 in | Wt 361.0 lb

## 2018-12-04 DIAGNOSIS — E538 Deficiency of other specified B group vitamins: Secondary | ICD-10-CM | POA: Diagnosis not present

## 2018-12-04 DIAGNOSIS — Z0000 Encounter for general adult medical examination without abnormal findings: Secondary | ICD-10-CM

## 2018-12-04 DIAGNOSIS — I10 Essential (primary) hypertension: Secondary | ICD-10-CM

## 2018-12-04 DIAGNOSIS — E559 Vitamin D deficiency, unspecified: Secondary | ICD-10-CM

## 2018-12-04 LAB — CBC WITH DIFFERENTIAL/PLATELET
Basophils Absolute: 0.1 10*3/uL (ref 0.0–0.1)
Basophils Relative: 0.7 % (ref 0.0–3.0)
Eosinophils Absolute: 0.1 10*3/uL (ref 0.0–0.7)
Eosinophils Relative: 1.2 % (ref 0.0–5.0)
HCT: 39.2 % (ref 36.0–46.0)
Hemoglobin: 13.1 g/dL (ref 12.0–15.0)
Lymphocytes Relative: 30.5 % (ref 12.0–46.0)
Lymphs Abs: 2.4 10*3/uL (ref 0.7–4.0)
MCHC: 33.4 g/dL (ref 30.0–36.0)
MCV: 87.8 fl (ref 78.0–100.0)
Monocytes Absolute: 0.7 10*3/uL (ref 0.1–1.0)
Monocytes Relative: 8.4 % (ref 3.0–12.0)
Neutro Abs: 4.7 10*3/uL (ref 1.4–7.7)
Neutrophils Relative %: 59.2 % (ref 43.0–77.0)
Platelets: 314 10*3/uL (ref 150.0–400.0)
RBC: 4.46 Mil/uL (ref 3.87–5.11)
RDW: 15.7 % — ABNORMAL HIGH (ref 11.5–15.5)
WBC: 7.9 10*3/uL (ref 4.0–10.5)

## 2018-12-04 LAB — BASIC METABOLIC PANEL
BUN: 11 mg/dL (ref 6–23)
CO2: 27 mEq/L (ref 19–32)
Calcium: 9 mg/dL (ref 8.4–10.5)
Chloride: 105 mEq/L (ref 96–112)
Creatinine, Ser: 0.94 mg/dL (ref 0.40–1.20)
GFR: 84.85 mL/min (ref >60.00)
Glucose, Bld: 101 mg/dL — ABNORMAL HIGH (ref 70–99)
Potassium: 3.7 mEq/L (ref 3.5–5.1)
Sodium: 138 mEq/L (ref 135–145)

## 2018-12-04 LAB — LIPID PANEL
Cholesterol: 144 mg/dL (ref 0–200)
HDL: 48.4 mg/dL (ref >39.00)
LDL Cholesterol: 77 mg/dL (ref 0–99)
NonHDL: 95.45
Total CHOL/HDL Ratio: 3
Triglycerides: 90 mg/dL (ref 0.0–149.0)
VLDL: 18 mg/dL (ref 0.0–40.0)

## 2018-12-04 MED ORDER — CYANOCOBALAMIN 1000 MCG/ML IJ SOLN
1000.0000 ug | Freq: Once | INTRAMUSCULAR | Status: AC
  Administered 2018-12-04: 1000 ug via INTRAMUSCULAR

## 2018-12-04 NOTE — Progress Notes (Signed)
Subjective:  Patient ID: Erica Mayer, female    DOB: [birth year]  Age: 28 y.o. MRN: [id number]  CC: Annual Exam and Hypertension   HPI Erica Mayer presents for a CPX.  She feels well today and offers no complaints.  She tells me she has not been taking the vitamin D supplement but she has been taking hydrochlorothiazide.  Outpatient Medications Prior to Visit  Medication Sig Dispense Refill  . B-D INSULIN SYRINGE 1CC/25GX1" 25G X 1" 1 ML MISC USE TO INJECT B12 MONTHLY 3 each 1  . cyanocobalamin (,VITAMIN B-12,) 1000 MCG/ML injection Inject 0.1 mLs (100 mcg total) into the muscle every 30 (thirty) days. 3 mL 1  . hydrochlorothiazide (HYDRODIURIL) 25 MG tablet TK 1 T PO QD IN THE MORNING 90 tablet 0  . SYRINGE-NEEDLE, DISP, 3 ML 25G X 5/8" 3 ML MISC Use to inject b12 monthly. 1 each 5  . Cholecalciferol 1.25 MG (50000 UT) capsule Take 1 capsule (50,000 Units total) by mouth once a week. 12 capsule 0   No facility-administered medications prior to visit.     ROS Review of Systems  Constitutional: Negative.  Negative for diaphoresis and fatigue.  HENT: Negative.   Eyes: Negative for visual disturbance.  Respiratory: Negative for apnea, cough, chest tightness and shortness of breath.   Cardiovascular: Negative for chest pain, palpitations and leg swelling.  Gastrointestinal: Negative for abdominal pain and constipation.  Endocrine: Negative.   Genitourinary: Negative.  Negative for difficulty urinating and hematuria.  Musculoskeletal: Negative.  Negative for arthralgias, back pain and neck pain.  Skin: Positive for color change.  Neurological: Negative.  Negative for dizziness, weakness, light-headedness and headaches.  Hematological: Negative for adenopathy. Does not bruise/bleed easily.  Psychiatric/Behavioral: Negative.     Objective:  BP 128/84 (BP Location: Left Arm, Patient Position: Sitting, Cuff Size: Large)   Pulse 67   Temp 98.3 F (36.8 C) (Oral)   Resp 16   Ht 5'  9" (1.753 m)   Wt (!) 361 lb (163.7 kg)   LMP 12/01/2018   SpO2 98%   BMI 53.31 kg/m   BP Readings from Last 3 Encounters:  12/04/18 128/84  10/07/18 130/70  04/03/18 (!) 144/98    Wt Readings from Last 3 Encounters:  12/04/18 (!) 361 lb (163.7 kg)  10/07/18 (!) 357 lb (161.9 kg)  04/03/18 (!) 362 lb 8 oz (164.4 kg)    Physical Exam Vitals signs reviewed.  Constitutional:      Appearance: Normal appearance. She is obese.  HENT:     Nose: Nose normal.     Mouth/Throat:     Mouth: Mucous membranes are moist.  Eyes:     General: No scleral icterus.    Conjunctiva/sclera: Conjunctivae normal.  Neck:     Musculoskeletal: Normal range of motion and neck supple.  Cardiovascular:     Rate and Rhythm: Normal rate and regular rhythm.     Heart sounds: No murmur.  Pulmonary:     Effort: Pulmonary effort is normal.     Breath sounds: No stridor. No wheezing, rhonchi or rales.  Abdominal:     General: Abdomen is protuberant. Bowel sounds are normal. There is no distension.     Palpations: Abdomen is soft. There is no hepatomegaly or splenomegaly.     Tenderness: There is no abdominal tenderness.  Musculoskeletal: Normal range of motion.     Right lower leg: No edema.     Left lower leg: No edema.  Lymphadenopathy:  Cervical: No cervical adenopathy.  Skin:    General: Skin is warm and dry.     Coloration: Skin is not pale.  Neurological:     General: No focal deficit present.     Mental Status: She is alert.  Psychiatric:        Mood and Affect: Mood normal.        Behavior: Behavior normal.     Lab Results  Component Value Date   WBC 7.9 12/04/2018   HGB 13.1 12/04/2018   HCT 39.2 12/04/2018   PLT 314.0 12/04/2018   GLUCOSE 101 (H) 12/04/2018   CHOL 144 12/04/2018   TRIG 90.0 12/04/2018   HDL 48.40 12/04/2018   LDLCALC 77 12/04/2018   ALT 30 04/03/2018   AST 20 04/03/2018   NA 138 12/04/2018   K 3.7 12/04/2018   CL 105 12/04/2018   CREATININE 0.94  12/04/2018   BUN 11 12/04/2018   CO2 27 12/04/2018   TSH 1.56 04/03/2018   HGBA1C 5.7 04/03/2018    Dg Chest 2 View  Result Date: 10/08/2018 CLINICAL DATA:  Cough for 6 weeks.  Smoker.  Hypertension. EXAM: CHEST - 2 VIEW COMPARISON:  None. FINDINGS: The heart size and mediastinal contours are within normal limits. Both lungs are clear. The visualized skeletal structures are unremarkable. IMPRESSION: Negative.  No active cardiopulmonary disease. Electronically Signed   By: Danae Orleans M.D.   On: 10/08/2018 08:11    Assessment & Plan:   Erica Mayer was seen today for annual exam and hypertension.  Diagnoses and all orders for this visit:  Essential hypertension- Her blood pressure is well controlled.  Electrolytes and renal function are normal. -     Basic metabolic panel; Future  B12 deficiency- She agrees to continue monthly B12 injections. -     CBC with Differential/Platelet; Future -     Folate; Future -     cyanocobalamin ((VITAMIN B-12)) injection 1,000 mcg  Routine general medical examination at a health care facility- Exam completed, labs reviewed, she refused a flu vaccine, Pap is up-to-date, patient education was given. -     Lipid panel; Future  Vitamin D deficiency disease- Her vitamin D level remains low.  Have asked her to restart the vitamin D supplement. -     VITAMIN D 25 Hydroxy (Vit-D Deficiency, Fractures); Future -     Cholecalciferol 1.25 MG (50000 UT) capsule; Take 1 capsule (50,000 Units total) by mouth once a week.   I am having Erica Mayer maintain her SYRINGE-NEEDLE (DISP) 3 ML, B-D INSULIN SYRINGE 1CC/25GX1", cyanocobalamin, hydrochlorothiazide, and Cholecalciferol. We administered cyanocobalamin.  Meds ordered this encounter  Medications  . cyanocobalamin ((VITAMIN B-12)) injection 1,000 mcg  . Cholecalciferol 1.25 MG (50000 UT) capsule    Sig: Take 1 capsule (50,000 Units total) by mouth once a week.    Dispense:  12 capsule    Refill:  0      Follow-up: Return in about 6 months (around 06/04/2019).  Sanda Linger, MD

## 2018-12-04 NOTE — Patient Instructions (Signed)
Health Maintenance, Female Adopting a healthy lifestyle and getting preventive care are important in promoting health and wellness. Ask your health care provider about:  The right schedule for you to have regular tests and exams.  Things you can do on your own to prevent diseases and keep yourself healthy. What should I know about diet, weight, and exercise? Eat a healthy diet   Eat a diet that includes plenty of vegetables, fruits, low-fat dairy products, and lean protein.  Do not eat a lot of foods that are high in solid fats, added sugars, or sodium. Maintain a healthy weight Body mass index (BMI) is used to identify weight problems. It estimates body fat based on height and weight. Your health care provider can help determine your BMI and help you achieve or maintain a healthy weight. Get regular exercise Get regular exercise. This is one of the most important things you can do for your health. Most adults should:  Exercise for at least 150 minutes each week. The exercise should increase your heart rate and make you sweat (moderate-intensity exercise).  Do strengthening exercises at least twice a week. This is in addition to the moderate-intensity exercise.  Spend less time sitting. Even light physical activity can be beneficial. Watch cholesterol and blood lipids Have your blood tested for lipids and cholesterol at 29 years of age, then have this test every 5 years. Have your cholesterol levels checked more often if:  Your lipid or cholesterol levels are high.  You are older than 29 years of age.  You are at high risk for heart disease. What should I know about cancer screening? Depending on your health history and family history, you may need to have cancer screening at various ages. This may include screening for:  Breast cancer.  Cervical cancer.  Colorectal cancer.  Skin cancer.  Lung cancer. What should I know about heart disease, diabetes, and high blood  pressure? Blood pressure and heart disease  High blood pressure causes heart disease and increases the risk of stroke. This is more likely to develop in people who have high blood pressure readings, are of African descent, or are overweight.  Have your blood pressure checked: ? Every 3-5 years if you are 18-39 years of age. ? Every year if you are 40 years old or older. Diabetes Have regular diabetes screenings. This checks your fasting blood sugar level. Have the screening done:  Once every three years after age 40 if you are at a normal weight and have a low risk for diabetes.  More often and at a younger age if you are overweight or have a high risk for diabetes. What should I know about preventing infection? Hepatitis B If you have a higher risk for hepatitis B, you should be screened for this virus. Talk with your health care provider to find out if you are at risk for hepatitis B infection. Hepatitis C Testing is recommended for:  Everyone born from 1945 through 1965.  Anyone with known risk factors for hepatitis C. Sexually transmitted infections (STIs)  Get screened for STIs, including gonorrhea and chlamydia, if: ? You are sexually active and are younger than 29 years of age. ? You are older than 29 years of age and your health care provider tells you that you are at risk for this type of infection. ? Your sexual activity has changed since you were last screened, and you are at increased risk for chlamydia or gonorrhea. Ask your health care provider if   you are at risk.  Ask your health care provider about whether you are at high risk for HIV. Your health care provider may recommend a prescription medicine to help prevent HIV infection. If you choose to take medicine to prevent HIV, you should first get tested for HIV. You should then be tested every 3 months for as long as you are taking the medicine. Pregnancy  If you are about to stop having your period (premenopausal) and  you may become pregnant, seek counseling before you get pregnant.  Take 400 to 800 micrograms (mcg) of folic acid every day if you become pregnant.  Ask for birth control (contraception) if you want to prevent pregnancy. Osteoporosis and menopause Osteoporosis is a disease in which the bones lose minerals and strength with aging. This can result in bone fractures. If you are 65 years old or older, or if you are at risk for osteoporosis and fractures, ask your health care provider if you should:  Be screened for bone loss.  Take a calcium or vitamin D supplement to lower your risk of fractures.  Be given hormone replacement therapy (HRT) to treat symptoms of menopause. Follow these instructions at home: Lifestyle  Do not use any products that contain nicotine or tobacco, such as cigarettes, e-cigarettes, and chewing tobacco. If you need help quitting, ask your health care provider.  Do not use street drugs.  Do not share needles.  Ask your health care provider for help if you need support or information about quitting drugs. Alcohol use  Do not drink alcohol if: ? Your health care provider tells you not to drink. ? You are pregnant, may be pregnant, or are planning to become pregnant.  If you drink alcohol: ? Limit how much you use to 0-1 drink a day. ? Limit intake if you are breastfeeding.  Be aware of how much alcohol is in your drink. In the [place] one drink equals one 12 oz bottle of beer (355 mL), one 5 oz glass of wine (148 mL), or one 1 oz glass of hard liquor (44 mL). General instructions  Schedule regular health, dental, and eye exams.  Stay current with your vaccines.  Tell your health care provider if: ? You often feel depressed. ? You have ever been abused or do not feel safe at home. Summary  Adopting a healthy lifestyle and getting preventive care are important in promoting health and wellness.  Follow your health care provider's instructions about healthy  diet, exercising, and getting tested or screened for diseases.  Follow your health care provider's instructions on monitoring your cholesterol and blood pressure. This information is not intended to replace advice given to you by your health care provider. Make sure you discuss any questions you have with your health care provider. Document Released: 08/08/2010 Document Revised: 01/16/2018 Document Reviewed: 01/16/2018 Elsevier Patient Education  2020 Else[employer] Inc.  

## 2018-12-05 LAB — FOLATE: Folate: 8.4 ng/mL (ref >5.9)

## 2018-12-05 LAB — VITAMIN D 25 HYDROXY (VIT D DEFICIENCY, FRACTURES): VITD: 20.67 ng/mL — ABNORMAL LOW (ref 30.00–100.00)

## 2018-12-06 ENCOUNTER — Encounter: Payer: Self-pay | Admitting: Internal Medicine

## 2018-12-06 MED ORDER — CHOLECALCIFEROL 1.25 MG (50000 UT) PO CAPS: 50000.0000 [IU] | ORAL_CAPSULE | ORAL | 0 refills | Status: DC

## 2019-03-17 ENCOUNTER — Other Ambulatory Visit: Payer: Self-pay | Admitting: Internal Medicine

## 2019-03-17 DIAGNOSIS — I10 Essential (primary) hypertension: Secondary | ICD-10-CM

## 2019-04-02 ENCOUNTER — Encounter: Payer: Self-pay | Admitting: Internal Medicine

## 2019-04-07 ENCOUNTER — Telehealth: Payer: Self-pay

## 2019-04-07 NOTE — Telephone Encounter (Signed)
Patient dropped off bariatric form to be completed. Mychart message regarding the same.   Form completed and letter completed for PCP to sign.

## 2019-04-18 ENCOUNTER — Other Ambulatory Visit: Payer: Self-pay | Admitting: Internal Medicine

## 2019-04-18 DIAGNOSIS — I10 Essential (primary) hypertension: Secondary | ICD-10-CM

## 2019-04-28 ENCOUNTER — Other Ambulatory Visit (HOSPITAL_COMMUNITY): Payer: Self-pay | Admitting: Surgery

## 2019-04-28 ENCOUNTER — Other Ambulatory Visit: Payer: Self-pay | Admitting: Surgery

## 2019-04-29 ENCOUNTER — Encounter: Payer: Self-pay | Admitting: Internal Medicine

## 2019-05-08 ENCOUNTER — Encounter: Payer: Managed Care, Other (non HMO) | Attending: Surgery | Admitting: Dietician

## 2019-05-08 ENCOUNTER — Encounter: Payer: Self-pay | Admitting: Dietician

## 2019-05-08 ENCOUNTER — Other Ambulatory Visit: Payer: Self-pay

## 2019-05-08 VITALS — Ht 69.0 in | Wt 363.4 lb

## 2019-05-08 DIAGNOSIS — R7303 Prediabetes: Secondary | ICD-10-CM | POA: Insufficient documentation

## 2019-05-08 DIAGNOSIS — Z9049 Acquired absence of other specified parts of digestive tract: Secondary | ICD-10-CM | POA: Diagnosis not present

## 2019-05-08 DIAGNOSIS — Z6841 Body Mass Index (BMI) 40.0 and over, adult: Secondary | ICD-10-CM | POA: Diagnosis not present

## 2019-05-08 DIAGNOSIS — Z79899 Other long term (current) drug therapy: Secondary | ICD-10-CM | POA: Diagnosis not present

## 2019-05-08 DIAGNOSIS — Z713 Dietary counseling and surveillance: Secondary | ICD-10-CM | POA: Diagnosis not present

## 2019-05-08 DIAGNOSIS — I10 Essential (primary) hypertension: Secondary | ICD-10-CM | POA: Diagnosis not present

## 2019-05-08 NOTE — Progress Notes (Signed)
Nutrition Assessment  Proposed Surgery: sleeve gastrectomy   Height: 5'9" Weight: 363.4 BMI: 53.66 Upper IBW% (UIBW): 227% (UIBW 160lbs)  Patient's Goal Weight: 220lbs  Medical History: HTN Medications and Supplements: HCTZ  Previous surgeries: 2015 cholecystectomy Drug allergies: none known Food allergies: none  Alcohol use: occasional 2 drinks per week  Tobacco use: none  Physical activity: cardio 30 minutes 1x a week  Weight history: Childhood: overweight    Adolescence: overweight    Adulthood: overweight, weight has increased gradually over the years    Weight 1 year ago: 350-360lbs  Dieting/ weight loss history:   Has tried keto diet, personal trainer and nutritionist-- lost the most weight on this program, but regained after she stopped the program; personal lifestyle changes  Decided on surgery due to previous diet "failures" -- short-term success only.   She is close to a cousin who has undergone weight loss surgery within the past year, providing support and witnessing her progress.  Dietary Recall:  Daily pattern: 2-3 meals and 2-3 snacks. Dining out: 1 meals per week. Breakfast: recently increased (working from home) -- sausage egg and cheese sandwich most days; leftovers from previous dinner Lunch: varies-- ham and [place] sandwich; chicken pasta alredo; etc Supper: steak, potato, corn, broccoli; sometimes none if other meals are later Snack(s): chips, snack cakes -- throughout day Beverages: water, lemonade (not diet)  Psychosocial: Emotional eating history: some extra eating when bored Disordered eating history: no Other: no  Intervention:  Patient has researched this procedure by consulting with family and friends, attended orientation seminar.   Instructed her on pre-op diet guidelines, including liver reduction diet.   Discussed stages of the bariatric diet after surgery as well as the importance of adequate protein and fluid intake.    Summary:  Patient has begun making positive diet and lifestyle changes including reducing fast foods, eliminating sodas, and reducing intake of sweets. She has lost some weight, about 8-10lbs.  She has solid support from family including daugther and mother, roommate, friends.   She agrees to work on Landscape architect, improving snack choices prior to surgery.   She is motivated to follow the bariatric diet after surgery. From a nutrition standpoint, she is ready to proceed with the bariatric surgery program.    Plan:  Patient commits to returning for pre-op class prior to surgery.   She will plan to return for post-op RD visits beginning 2 weeks after surgery.

## 2019-05-08 NOTE — Patient Instructions (Signed)
   Start tracking food intake to increase awareness of food intake. Pen and paper diary, or an app such as MyFitnessPal, LoseIt, or Baritastic.  Include healthy options for snacks such as lowfat cheese or small portion of nuts or yogurt + fruit, 1-2 graham crackers, whole grain crackers such as Triscuits, low fat popcorn (3 cups) such as skinny pop.  Begin investigating options for protein shakes, either ready made or powder forms, that meet the 15g protein and 5g carb criteria.

## 2019-05-16 ENCOUNTER — Other Ambulatory Visit: Payer: Self-pay

## 2019-05-16 ENCOUNTER — Ambulatory Visit (HOSPITAL_COMMUNITY)
Admission: RE | Admit: 2019-05-16 | Discharge: 2019-05-16 | Disposition: A | Discharge status: Home / Self Care | Payer: Managed Care, Other (non HMO) | Source: Ambulatory Visit | Attending: Surgery | Admitting: Surgery

## 2019-06-23 ENCOUNTER — Other Ambulatory Visit: Payer: Self-pay | Admitting: Internal Medicine

## 2019-06-23 DIAGNOSIS — I10 Essential (primary) hypertension: Secondary | ICD-10-CM

## 2019-09-01 ENCOUNTER — Ambulatory Visit: Payer: Managed Care, Other (non HMO) | Attending: Internal Medicine

## 2019-09-01 DIAGNOSIS — Z20822 Contact with and (suspected) exposure to covid-19: Secondary | ICD-10-CM

## 2019-09-02 ENCOUNTER — Encounter: Payer: Self-pay | Admitting: Internal Medicine

## 2019-09-02 LAB — SARS-COV-2, NAA 2 DAY TAT

## 2019-09-02 LAB — NOVEL CORONAVIRUS, NAA: SARS-CoV-2, NAA: NOT DETECTED

## 2019-09-03 ENCOUNTER — Telehealth (INDEPENDENT_AMBULATORY_CARE_PROVIDER_SITE_OTHER): Payer: Managed Care, Other (non HMO) | Admitting: Family

## 2019-09-03 ENCOUNTER — Other Ambulatory Visit: Payer: Self-pay | Admitting: Family

## 2019-09-03 DIAGNOSIS — J019 Acute sinusitis, unspecified: Secondary | ICD-10-CM

## 2019-09-03 MED ORDER — AMOXICILLIN-POT CLAVULANATE 875-125 MG PO TABS: 1.0000 | ORAL_TABLET | Freq: Two times a day (BID) | ORAL | 0 refills | Status: AC

## 2019-09-03 MED ORDER — FLUTICASONE PROPIONATE 50 MCG/ACT NA SUSP: 2.0000 | Freq: Every day | NASAL | 6 refills | Status: AC

## 2019-09-03 NOTE — Progress Notes (Signed)
Erica Mayer is a 29 y.o. female with the following history as recorded in Epic[facility]:  Patient Active Problem List   Diagnosis Date Noted  . Routine general medical examination at a health care facility 12/04/2018  . Herpes simplex type 1 infection 08/19/2018  . Irregular periods 08/19/2018  . Polycystic ovaries 08/19/2018  . B12 deficiency 04/04/2018  . Vitamin D deficiency disease 04/04/2018  . Prediabetes 04/04/2018  . Morbid obesity (HCC) 04/03/2018  . Deficiency anemia 04/03/2018  . Essential hypertension 04/03/2018  . 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 Cervical intraepithelial neoplasia grade 1 08/07/2011    Current Outpatient Medications  Medication Sig Dispense Refill  . amoxicillin-clavulanate (AUGMENTIN) 875-125 MG tablet Take 1 tablet by mouth 2 (two) times daily for 10 days. 20 tablet 0  . fluticasone (FLONASE) 50 MCG/ACT nasal spray Place 2 sprays into both nostrils daily. 16 g 6  . hydrochlorothiazide (HYDRODIURIL) 25 MG tablet TAKE 1 TABLET BY MOUTH EVERY DAY IN THE MORNING 90 tablet 0   No current facility-administered medications for this visit.    Allergies: Patient has no known allergies.  Past Medical History:  Diagnosis Date  . Abnormal Pap smear 08/2011   LGSIL/CIN-1  . Obesity   . Oligomenorrhea   . PCOS (polycystic ovarian syndrome)   . Weight gain 09/09/2008    Past Surgical History:  Procedure Laterality Date  . CHOLECYSTECTOMY N/A 11/02/2013   Procedure: LAPAROSCOPIC CHOLECYSTECTOMY WITH INTRAOPERATIVE CHOLANGIOGRAM;  Surgeon: Avel Peace, MD;  Location: [facility];  Service: General;  Laterality: N/A;  . ERCP N/A 11/06/2013   Procedure: ENDOSCOPIC RETROGRADE CHOLANGIOPANCREATOGRAPHY (ERCP);  Surgeon: Theda Belfast, MD;  Location: [facility];  Service: Endoscopy;  Laterality: N/A;    Family History  Problem Relation Age of Onset  . Kidney disease Mother   . Heart disease Father        congestive heart failure  .  Diabetes Father   . Hypertension Father   . Cancer Paternal Aunt     Social History   Tobacco Use  . Smoking status: Former Smoker    Packs/day: 0.30    Years: 12.00    Pack years: 3.60    Types: Cigarettes  . Smokeless tobacco: Never Used  . Tobacco comment: pt smoke black and mild  Substance Use Topics  . Alcohol use: Yes    Alcohol/week: 2.0 standard drinks    Types: 2 Standard drinks or equivalent per week    Comment: occasional    Subjective:    I connected with Kween Toto on 09/03/19 at 11:40 AM EDT by a video enabled telemedicine application and verified that I am speaking with the correct person using two identifiers.   I discussed the limitations of evaluation and management by telemedicine and the availability of in person appointments. The patient expressed understanding and agreed to proceed. Provider in office/ patient is at home; provider and patient are only 2 people on video call.   Woke up on Sunday with sore throat and sinus congestion; + allergy symptoms; had negative COVID test on Monday; using OTC Sudafed and Mucinex; not prone to sinus infection;   LMP July 3     Objective:  There were no vitals filed for this visit.  General: Well developed, well nourished, in no acute distress  Head: Normocephalic and atraumatic  Lungs: Respirations unlabored;  Neurologic: Alert and oriented; speech intact; face symmetrical;   Assessment:  1. Acute sinusitis, recurrence not specified, unspecified location  Plan:  Suspect allergy component; d/c Mucinex; add Claritin and Flonase; okay to continue Sudafed; Rx for Augmentin 875 mg bid x 10 days; increase fluids, rest; work note given as requested; follow up worse, no better.    No follow-ups on file.  No orders of the defined types were placed in this encounter.   Requested Prescriptions   Signed Prescriptions Disp Refills  . fluticasone (FLONASE) 50 MCG/ACT nasal spray 16 g 6    Sig: Place 2 sprays into  both nostrils daily.  Marland Kitchen amoxicillin-clavulanate (AUGMENTIN) 875-125 MG tablet 20 tablet 0    Sig: Take 1 tablet by mouth 2 (two) times daily for 10 days.

## 2019-09-27 ENCOUNTER — Encounter: Payer: Self-pay | Admitting: Internal Medicine

## 2019-09-29 ENCOUNTER — Other Ambulatory Visit: Payer: Self-pay | Admitting: Internal Medicine

## 2019-09-29 DIAGNOSIS — R7303 Prediabetes: Secondary | ICD-10-CM

## 2019-09-29 MED ORDER — OZEMPIC (0.25 OR 0.5 MG/DOSE) 2 MG/1.5ML ~~LOC~~ SOPN: 0.2500 mg | PEN_INJECTOR | SUBCUTANEOUS | 0 refills | Status: DC

## 2019-09-29 MED ORDER — NOVOFINE 32G X 6 MM MISC: 1.0000 | Freq: Every day | 0 refills | Status: AC

## 2019-10-02 ENCOUNTER — Telehealth: Payer: Self-pay

## 2019-10-02 NOTE — Telephone Encounter (Signed)
Key: [id number]

## 2019-10-03 ENCOUNTER — Other Ambulatory Visit: Payer: Self-pay | Admitting: Internal Medicine

## 2019-10-03 NOTE — Telephone Encounter (Signed)
Fax from [facility] has been denied due to criteria not being met. Dr. Ronnald Ramp is aware.

## 2019-10-15 ENCOUNTER — Other Ambulatory Visit: Payer: Self-pay | Admitting: Internal Medicine

## 2019-10-15 DIAGNOSIS — I10 Essential (primary) hypertension: Secondary | ICD-10-CM

## 2019-11-03 ENCOUNTER — Other Ambulatory Visit: Payer: Self-pay | Admitting: Internal Medicine

## 2019-11-03 MED ORDER — SAXENDA 18 MG/3ML ~~LOC~~ SOPN: 3.0000 mg | PEN_INJECTOR | Freq: Every day | SUBCUTANEOUS | 1 refills | Status: AC

## 2019-11-05 ENCOUNTER — Encounter: Payer: Managed Care, Other (non HMO) | Admitting: Physician Assistant

## 2019-11-27 ENCOUNTER — Ambulatory Visit: Payer: Managed Care, Other (non HMO) | Admitting: Physician Assistant

## 2019-11-27 ENCOUNTER — Encounter: Payer: Self-pay | Admitting: Physician Assistant

## 2019-11-27 ENCOUNTER — Other Ambulatory Visit: Payer: Self-pay

## 2019-11-27 VITALS — BP 130/64 | HR 67 | Temp 97.6°F | Resp 17 | Ht 68.0 in | Wt 396.0 lb

## 2019-11-27 DIAGNOSIS — Z008 Encounter for other general examination: Secondary | ICD-10-CM

## 2019-11-27 DIAGNOSIS — Z0000 Encounter for general adult medical examination without abnormal findings: Secondary | ICD-10-CM | POA: Diagnosis not present

## 2019-11-27 NOTE — Progress Notes (Signed)
Subjective:    Patient ID: Erica Mayer, female    DOB: Sep 25, 1990, 29 y.o.   MRN: [id number]  HPI 29 yo F from [facility] with [facility] presents for [place] and brief exam  Check in made her aware of a recent 20 pound gain from 370 to present 396 in the last few months. Reports her usual weight for past 2 years has been 350-370.  Always 300s for as long as she can remember  Med review reveals she is not using Liraglutide-weight management or Insulin pen that was ordered by PCP last time she saw him.  Apparently filling prescription and obtaining a glucometer at pharmacy required paperwork and some complexity to obtain Rx and refills and she opted not to pursue.          PCP had referenced pre-diabetes during the last  visit but she denies any knowledge of DM.   Her father is Diabetic.  Has not focused on any diet restriction or exercise program in interim. Loves potato chips and open about overeating them. "Loves salt" Often skips breakfast and then doesn't eat until 2 pm -by evening is very hungry and over eats. Has not had obvious low blood sugar episodes- no idea what her labs have been- was given informational handouts on appropriate diet guidelines for weight loss but states that she didn't address them and no longer knows where they are.  Take HCTZ 25 mg as ordered 1 QD- high salt diet  Expresses desire to lose weight  Has had Covid vaccine 1 &2 Has PCP: Dr Etta Grandchild, Internal Medicine  Review of Systems As noted above    Objective:   Physical Exam Vitals and nursing note reviewed.  Constitutional:      Comments: Morbidly obese    5'8"    396 lbs     BMI greater than 54  HENT:     Head: Normocephalic and atraumatic.     Right Ear: Tympanic membrane, ear canal and external ear normal.     Left Ear: Tympanic membrane, ear canal and external ear normal.     Nose: Nose normal.     Mouth/Throat:     Mouth: Mucous membranes are moist.     Comments: Sees DDS q 6  months Eyes:     Extraocular Movements: Extraocular movements intact.     Conjunctiva/sclera: Conjunctivae normal.  Cardiovascular:     Rate and Rhythm: Normal rate and regular rhythm.     Pulses: Normal pulses.     Heart sounds: Normal heart sounds.  Pulmonary:     Effort: Pulmonary effort is normal.     Breath sounds: Normal breath sounds.  Abdominal:     General: Bowel sounds are normal.     Comments: Habitus makes adequate exam limited , no mass identified, no tenderness reported.  Genitourinary:    Comments: nullip reported SBE practiced Gyn for care Musculoskeletal:        General: Normal range of motion.     Cervical back: Normal range of motion and neck supple.     Right lower leg: Edema present.     Left lower leg: Edema present.     Comments: Pitting + to mid shin  Lymphadenopathy:     Cervical: No cervical adenopathy.  Skin:    General: Skin is warm and dry.     Capillary Refill: Capillary refill takes less than 2 seconds.  Neurological:     General: No focal deficit present.  Mental Status: She is alert.     Cranial Nerves: No cranial nerve deficit.  Psychiatric:        Mood and Affect: Mood normal.        Behavior: Behavior normal.     Comments: Very pleasant patient, but needs to take ownership of weight issues and get started with better self-care.        Assessment & Plan:   Have encouraged patient to connect with the nurses at her PCP office to ask about reinstating the glucometer order so she can do AM fasting checks a few times a week to help her understand her current status and get a handle on late night snacking/ meal choices.  Keep records  Discussed A1C as reference point for broader summary of elevated sugars- PCP staff may have teaching tools- ask about nutritional counseling available/ needs follow up with primary provider team  to encourage continuum   30 minute brisk walk daily 11-15-08 until she can tolerate 15/15. Needs sneakers at desk  not flat open sandals for her walks.  Take the first step...  Discussed "If nothing changes-nothing changes" Reports My Chart as active  Will report labs as available

## 2019-11-28 LAB — LIPID PANEL
Chol/HDL Ratio: 2.7 ratio (ref 0.0–4.4)
Cholesterol, Total: 151 mg/dL (ref 100–199)
HDL: 56 mg/dL (ref >39)
LDL Chol Calc (NIH): 83 mg/dL (ref 0–99)
Triglycerides: 56 mg/dL (ref 0–149)
VLDL Cholesterol Cal: 12 mg/dL (ref 5–40)

## 2019-11-28 LAB — GLUCOSE, RANDOM: Glucose: 102 mg/dL — ABNORMAL HIGH (ref 65–99)

## 2019-12-08 ENCOUNTER — Other Ambulatory Visit: Payer: Self-pay

## 2019-12-08 ENCOUNTER — Other Ambulatory Visit: Payer: Managed Care, Other (non HMO)

## 2019-12-09 LAB — URINALYSIS, ROUTINE W REFLEX MICROSCOPIC
Bilirubin, UA: NEGATIVE
Glucose, UA: NEGATIVE
Ketones, UA: NEGATIVE
Leukocytes,UA: NEGATIVE
Nitrite, UA: NEGATIVE
Protein,UA: NEGATIVE
Specific Gravity, UA: >=1.03 — AB (ref 1.005–1.030)
Urobilinogen, Ur: 1 mg/dL (ref 0.2–1.0)
pH, UA: 5 (ref 5.0–7.5)

## 2019-12-09 LAB — LIPID PANEL
Chol/HDL Ratio: 2.8 ratio (ref 0.0–4.4)
Cholesterol, Total: 150 mg/dL (ref 100–199)
HDL: 54 mg/dL (ref >39)
LDL Chol Calc (NIH): 84 mg/dL (ref 0–99)
Triglycerides: 61 mg/dL (ref 0–149)
VLDL Cholesterol Cal: 12 mg/dL (ref 5–40)

## 2019-12-09 LAB — MICROSCOPIC EXAMINATION: Casts: NONE SEEN /lpf

## 2019-12-09 LAB — COMPREHENSIVE METABOLIC PANEL
ALT: 24 IU/L (ref 0–32)
AST: 25 IU/L (ref 0–40)
Albumin/Globulin Ratio: 1.4 (ref 1.2–2.2)
Albumin: 4.2 g/dL (ref 3.9–5.0)
Alkaline Phosphatase: 93 IU/L (ref 44–121)
BUN/Creatinine Ratio: 13 (ref 9–23)
BUN: 13 mg/dL (ref 6–20)
Bilirubin Total: 0.2 mg/dL (ref 0.0–1.2)
CO2: 22 mmol/L (ref 20–29)
Calcium: 8.9 mg/dL (ref 8.7–10.2)
Chloride: 104 mmol/L (ref 96–106)
Creatinine, Ser: 1 mg/dL (ref 0.57–1.00)
GFR calc Af Amer: 87 mL/min/{1.73_m2} (ref >59)
GFR calc non Af Amer: 76 mL/min/{1.73_m2} (ref >59)
Globulin, Total: 3.1 g/dL (ref 1.5–4.5)
Glucose: 107 mg/dL — ABNORMAL HIGH (ref 65–99)
Potassium: 4.3 mmol/L (ref 3.5–5.2)
Sodium: 139 mmol/L (ref 134–144)
Total Protein: 7.3 g/dL (ref 6.0–8.5)

## 2019-12-09 LAB — VITAMIN B12: Vitamin B-12: 222 pg/mL — ABNORMAL LOW (ref 232–1245)

## 2019-12-09 LAB — PROTIME-INR
INR: 0.9 (ref 0.9–1.2)
Prothrombin Time: 9.5 s (ref 9.1–12.0)

## 2019-12-09 LAB — H. PYLORI ANTIBODY, IGG: H. pylori, IgG AbS: 0.31 Index Value (ref 0.00–0.79)

## 2019-12-09 LAB — FOLATE: Folate: 7.8 ng/mL (ref >3.0)

## 2019-12-09 LAB — TSH: TSH: 1.96 u[IU]/mL (ref 0.450–4.500)

## 2019-12-09 LAB — T4: T4, Total: 6.4 ug/dL (ref 4.5–12.0)

## 2019-12-09 LAB — HGB A1C W/O EAG: Hgb A1c MFr Bld: 5.9 % — ABNORMAL HIGH (ref 4.8–5.6)

## 2019-12-09 LAB — HCG, SERUM, QUALITATIVE: hCG,Beta Subunit,Qual,Serum: NEGATIVE m[IU]/mL (ref <6)

## 2019-12-09 LAB — IRON: Iron: 45 ug/dL (ref 27–159)

## 2019-12-10 NOTE — Addendum Note (Signed)
Addended by: Fabio Bering D on: 12/10/2019 08:27 AM   Modules accepted: Orders

## 2019-12-11 LAB — CBC WITH DIFFERENTIAL/PLATELET
Basophils Absolute: 0.2 10*3/uL (ref 0.0–0.2)
Basos: 2 %
EOS (ABSOLUTE): 0.2 10*3/uL (ref 0.0–0.4)
Eos: 2 %
Hematocrit: 38.8 % (ref 34.0–46.6)
Hemoglobin: 12.5 g/dL (ref 11.1–15.9)
Immature Grans (Abs): 0.1 10*3/uL (ref 0.0–0.1)
Immature Granulocytes: 1 %
Lymphocytes Absolute: 2.3 10*3/uL (ref 0.7–3.1)
Lymphs: 29 %
MCH: 29.5 pg (ref 26.6–33.0)
MCHC: 32.2 g/dL (ref 31.5–35.7)
MCV: 92 fL (ref 79–97)
Monocytes Absolute: 0.7 10*3/uL (ref 0.1–0.9)
Monocytes: 8 %
Neutrophils Absolute: 4.6 10*3/uL (ref 1.4–7.0)
Neutrophils: 58 %
Platelets: 283 10*3/uL (ref 150–450)
RBC: 4.24 x10E6/uL (ref 3.77–5.28)
RDW: 14.8 % (ref 11.7–15.4)
WBC: 7.9 10*3/uL (ref 3.4–10.8)

## 2019-12-11 LAB — VITAMIN D 25 HYDROXY (VIT D DEFICIENCY, FRACTURES): Vit D, 25-Hydroxy: 20.8 ng/mL — ABNORMAL LOW (ref 30.0–100.0)

## 2019-12-11 LAB — SPECIMEN STATUS REPORT

## 2019-12-12 ENCOUNTER — Other Ambulatory Visit: Payer: Self-pay | Admitting: Surgery

## 2019-12-15 ENCOUNTER — Ambulatory Visit: Payer: 59 | Admitting: Professional

## 2019-12-29 ENCOUNTER — Ambulatory Visit: Payer: 59 | Admitting: Professional

## 2020-02-02 ENCOUNTER — Other Ambulatory Visit: Payer: Self-pay | Admitting: Internal Medicine

## 2020-02-02 DIAGNOSIS — I10 Essential (primary) hypertension: Secondary | ICD-10-CM

## 2020-05-03 ENCOUNTER — Other Ambulatory Visit: Payer: Self-pay | Admitting: Internal Medicine

## 2020-05-03 DIAGNOSIS — I10 Essential (primary) hypertension: Secondary | ICD-10-CM

## 2020-08-23 ENCOUNTER — Other Ambulatory Visit: Payer: Self-pay | Admitting: Internal Medicine

## 2020-08-23 DIAGNOSIS — I10 Essential (primary) hypertension: Secondary | ICD-10-CM

## 2020-08-26 ENCOUNTER — Other Ambulatory Visit: Payer: Self-pay | Admitting: Internal Medicine

## 2020-08-26 DIAGNOSIS — I10 Essential (primary) hypertension: Secondary | ICD-10-CM

## 2021-06-03 ENCOUNTER — Other Ambulatory Visit: Payer: Self-pay | Admitting: Obstetrics and Gynecology

## 2021-06-03 DIAGNOSIS — Q5128 Other doubling of uterus, other specified: Secondary | ICD-10-CM

## 2021-06-27 ENCOUNTER — Ambulatory Visit
Admission: RE | Admit: 2021-06-27 | Discharge: 2021-06-27 | Disposition: A | Discharge status: Home / Self Care | Payer: Managed Care, Other (non HMO) | Source: Ambulatory Visit | Attending: Obstetrics and Gynecology | Admitting: Obstetrics and Gynecology

## 2021-06-27 DIAGNOSIS — Q5128 Other doubling of uterus, other specified: Secondary | ICD-10-CM

## 2024-03-08 IMAGING — MR MR PELVIS W/O CM
8 of 10 series · 33 of 48 positions shown · non-contrast
Comparison: None Available.

CLINICAL DATA: Possible uterine didelphys on office ultrasound

EXAM:
MRI PELVIS WITHOUT CONTRAST
TECHNIQUE: Multiplanar multisequence MR imaging of the pelvis was performed. No
intravenous contrast was administered.

[Series 4: T2 · coronal · 4.0mm · 1.25mm/px · 3 of 36 slices shown (1 of 5)]
[im 1/36]
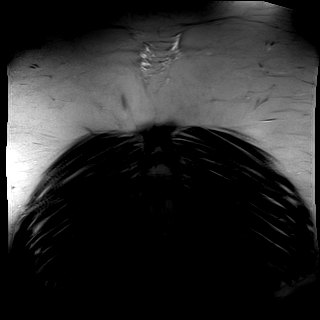
[im 18/36]
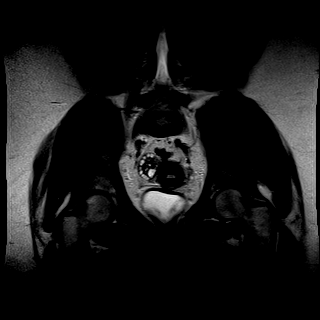
[im 36/36]
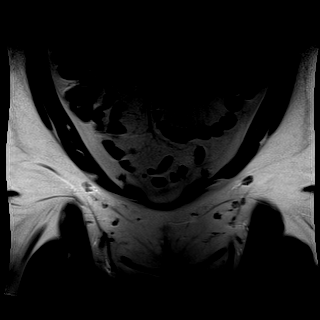

[Series 5: T2 · sagittal · 3.0mm · 0.78mm/px · 4 of 40 slices shown (2 of 5)]
[im 1/40]
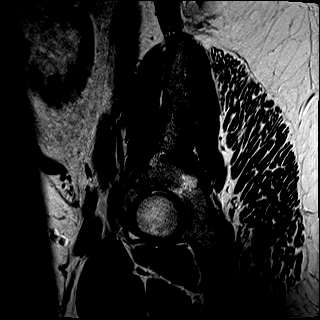
[im 14/40]
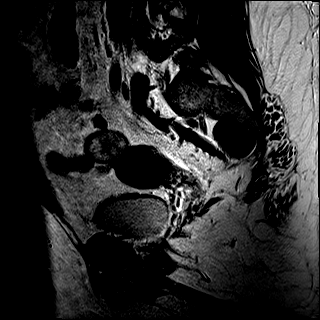
[im 27/40]
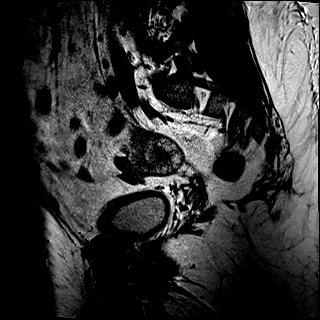
[im 40/40]
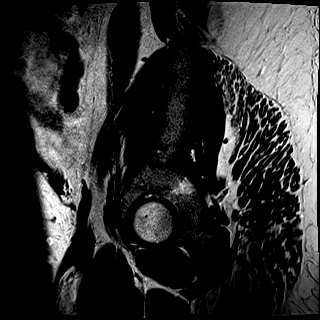

[Series 6: T2 · axial · 5.0mm · 0.41mm/px · z∈[-27,+147]mm · 3 of 30 slices shown (3 of 5)]
[im 1/30]
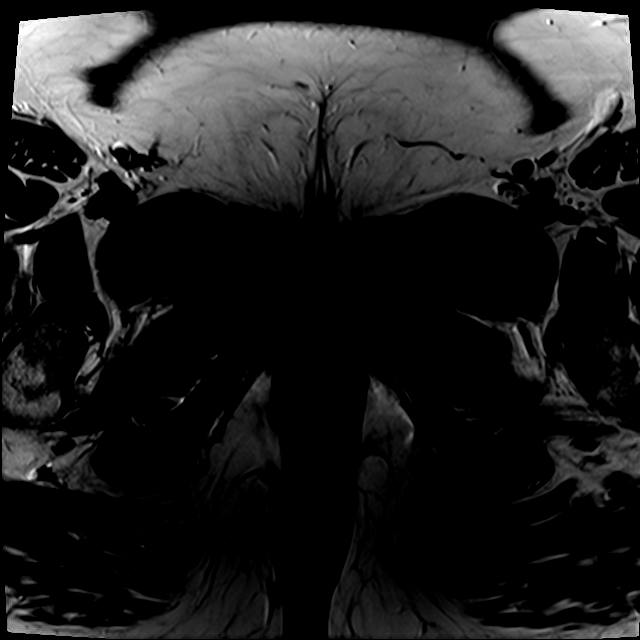
[im 15/30]
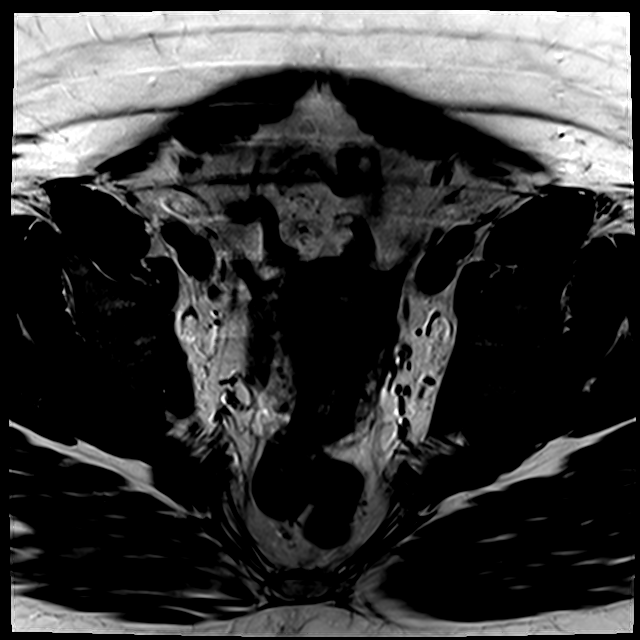
[im 30/30]
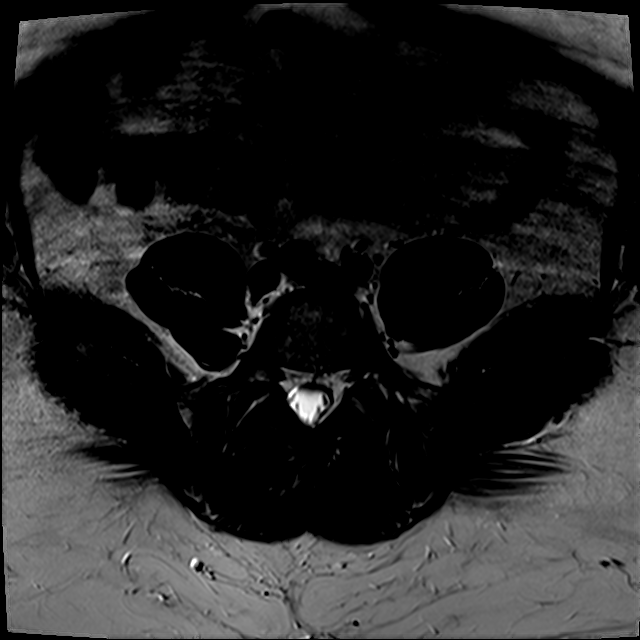

[Series 8: T2 · axial · 4.0mm · 1.25mm/px · z∈[+106,+188]mm · 2 of 20 slices shown (4 of 5)]
[im 1/20]
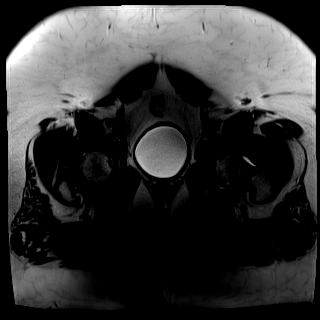
[im 20/20]
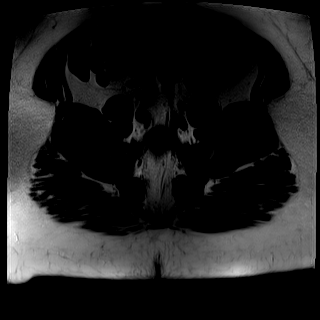

[Series 9: T2 · coronal · 4.0mm · 0.38mm/px · 3 of 27 slices shown (5 of 5)]
[im 1/27]
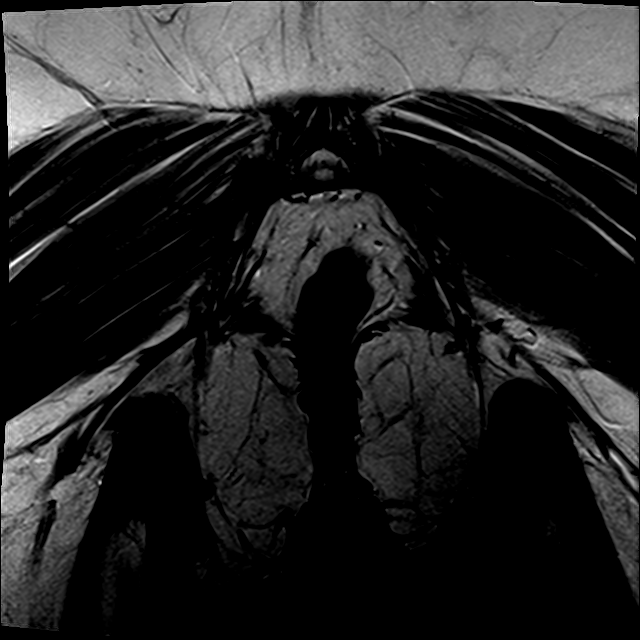
[im 14/27]
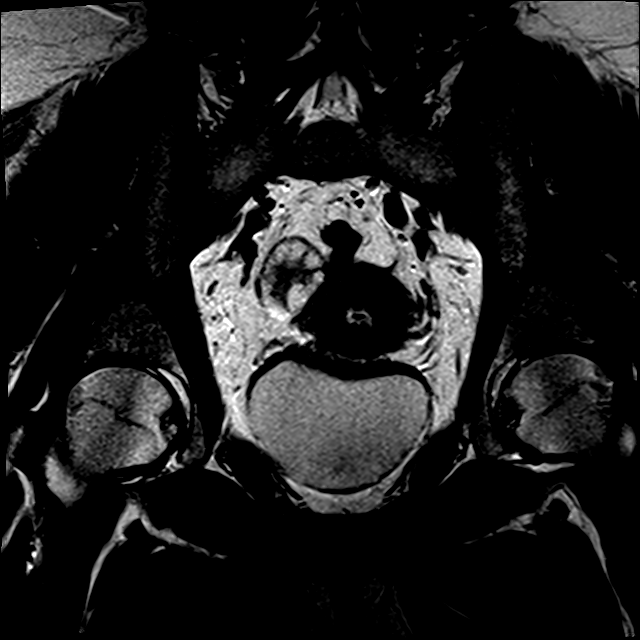
[im 27/27]
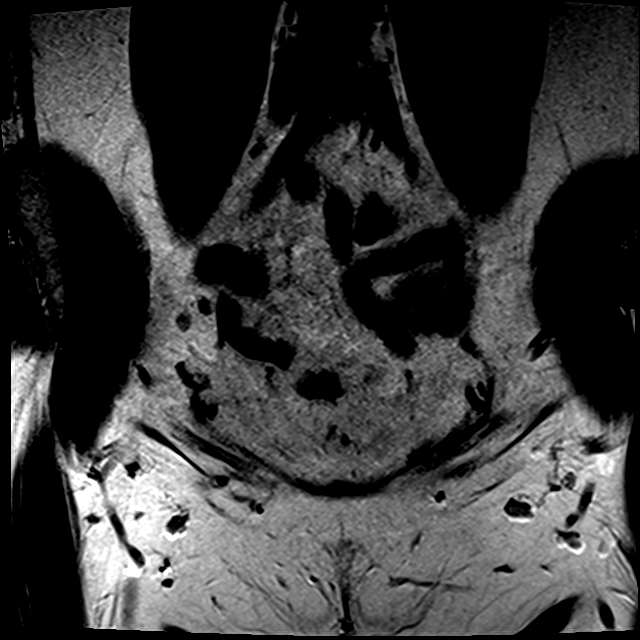

[Series 10: T1 · axial · 5.0mm · 1.12mm/px · z∈[-31,+164]mm · 8 of 80 slices shown]
[im 1/80]
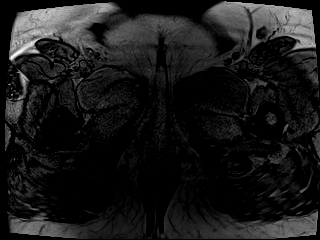
[im 12/80]
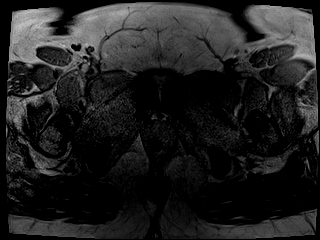
[im 23/80]
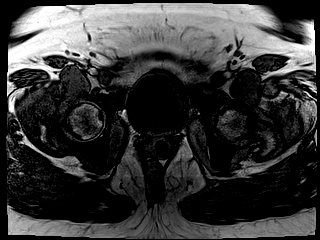
[im 34/80]
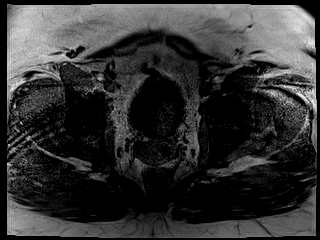
[im 46/80]
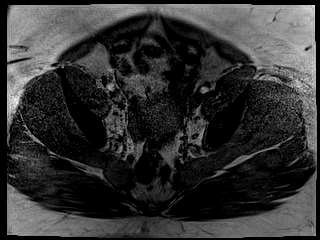
[im 57/80]
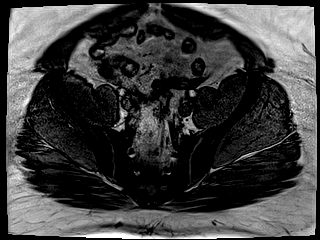
[im 68/80]
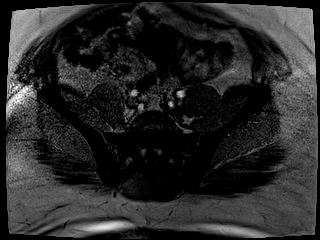
[im 80/80]
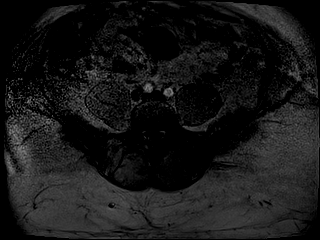

[Series 11: T1 fat-sat · axial · 1.2mm · 0.75mm/px · z∈[-46,+145]mm · 8 of 160 slices shown]
[im 1/160]
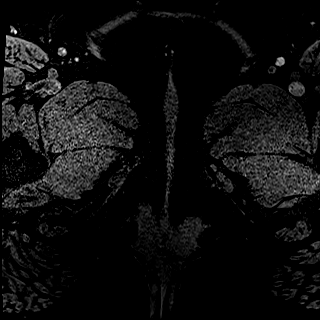
[im 23/160]
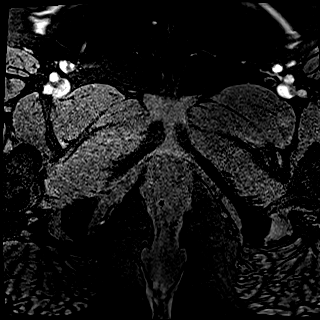
[im 46/160]
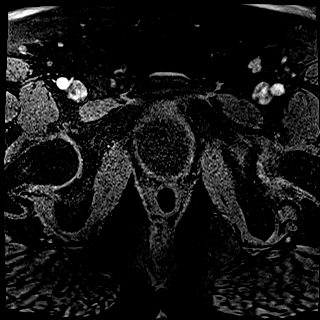
[im 69/160]
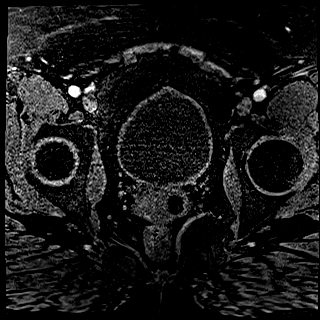
[im 91/160]
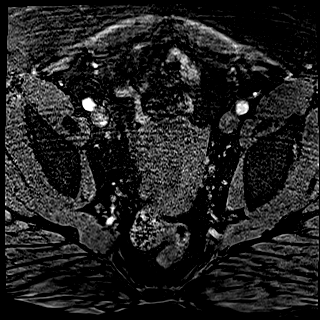
[im 114/160]
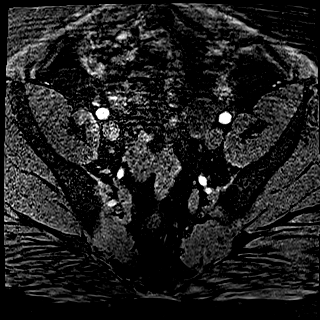
[im 137/160]
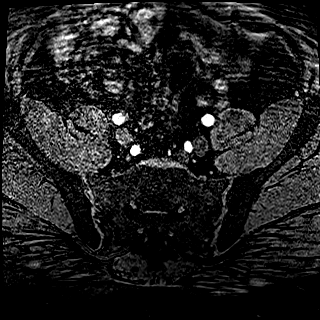
[im 160/160]
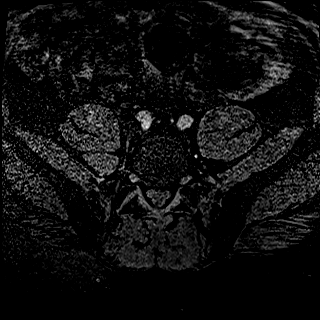

[Series 12: T2 post-contrast · sagittal · 3.0mm · 0.78mm/px · 2 of 40 slices shown]
[im 1/40]
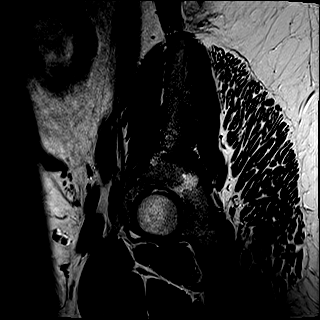
[im 14/40]
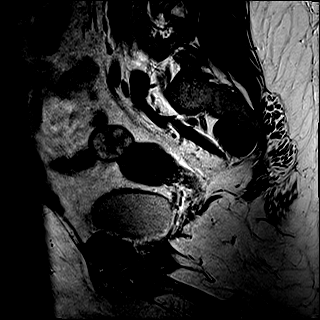

[33 of 48 positions shown; findings below may reference images not displayed]

FINDINGS: Urinary Tract:  Bladder is within normal limits.

Bowel:  Visualized bowel is unremarkable.

Vascular/Lymphatic: No evidence of aneurysm.

No suspicious pelvic lymphadenopathy.

Reproductive: Normal uterine configuration. No cervical or uterine
duplication.

Multiple peripheral immature follicles in the bilateral ovaries,
raising the possibility of PCOS in the appropriate clinical setting.

Other:  No pelvic ascites.

Musculoskeletal: No focal osseous lesions.
IMPRESSION: Normal uterine configuration. No evidence of cervical or uterine
duplication.

Multiple peripheral immature follicles in the bilateral ovaries,
raising the possibility of PCOS in the appropriate clinical setting.
